# Patient Record
Sex: Female | Born: 1960
Health system: Southern US, Community
[De-identification: ages and names within clinical notes are randomized; demographics above are authoritative.]

## PROBLEM LIST (undated history)

## (undated) DIAGNOSIS — K9041 Non-celiac gluten sensitivity: Secondary | ICD-10-CM

## (undated) DIAGNOSIS — N9489 Other specified conditions associated with female genital organs and menstrual cycle: Secondary | ICD-10-CM

## (undated) DIAGNOSIS — M349 Systemic sclerosis, unspecified: Secondary | ICD-10-CM

## (undated) DIAGNOSIS — I7300 Raynaud's syndrome without gangrene: Secondary | ICD-10-CM

## (undated) HISTORY — DX: Raynaud's syndrome without gangrene: I73.00

## (undated) HISTORY — DX: Systemic sclerosis, unspecified: M34.9

## (undated) HISTORY — PX: TONSILLECTOMY: SUR1361

## (undated) HISTORY — DX: Other specified conditions associated with female genital organs and menstrual cycle: N94.89

---

## 1988-02-26 HISTORY — PX: DILATION AND CURETTAGE OF UTERUS: SHX78

## 1997-09-29 ENCOUNTER — Other Ambulatory Visit: Admission: RE | Admit: 1997-09-29 | Discharge: 1997-09-29 | Payer: Self-pay | Admitting: Obstetrics & Gynecology

## 2000-03-07 ENCOUNTER — Other Ambulatory Visit: Admission: RE | Admit: 2000-03-07 | Discharge: 2000-03-07 | Payer: Self-pay | Admitting: Obstetrics & Gynecology

## 2001-04-06 ENCOUNTER — Other Ambulatory Visit: Admission: RE | Admit: 2001-04-06 | Discharge: 2001-04-06 | Payer: Self-pay | Admitting: Obstetrics and Gynecology

## 2001-05-27 ENCOUNTER — Encounter: Admission: RE | Admit: 2001-05-27 | Discharge: 2001-08-25 | Payer: Self-pay

## 2001-10-01 ENCOUNTER — Encounter: Admission: RE | Admit: 2001-10-01 | Discharge: 2001-12-30 | Payer: Self-pay | Admitting: *Deleted

## 2002-04-06 ENCOUNTER — Encounter: Admission: RE | Admit: 2002-04-06 | Discharge: 2002-07-05 | Payer: Self-pay | Admitting: *Deleted

## 2004-03-19 ENCOUNTER — Other Ambulatory Visit: Admission: RE | Admit: 2004-03-19 | Discharge: 2004-03-19 | Payer: Self-pay | Admitting: Obstetrics and Gynecology

## 2004-09-29 ENCOUNTER — Encounter: Admission: RE | Admit: 2004-09-29 | Discharge: 2004-09-29 | Payer: Self-pay | Admitting: Diagnostic Radiology

## 2005-04-15 ENCOUNTER — Other Ambulatory Visit: Admission: RE | Admit: 2005-04-15 | Discharge: 2005-04-15 | Payer: Self-pay | Admitting: Obstetrics and Gynecology

## 2005-04-18 ENCOUNTER — Encounter: Admission: RE | Admit: 2005-04-18 | Discharge: 2005-04-18 | Payer: Self-pay | Admitting: Obstetrics & Gynecology

## 2006-11-04 ENCOUNTER — Ambulatory Visit: Payer: Self-pay | Admitting: Physical Medicine & Rehabilitation

## 2006-11-04 ENCOUNTER — Encounter
Admission: RE | Admit: 2006-11-04 | Discharge: 2007-02-02 | Payer: Self-pay | Admitting: Physical Medicine & Rehabilitation

## 2006-11-21 ENCOUNTER — Encounter
Admission: RE | Admit: 2006-11-21 | Discharge: 2006-11-21 | Payer: Self-pay | Admitting: Physical Medicine & Rehabilitation

## 2007-01-16 ENCOUNTER — Ambulatory Visit: Payer: Self-pay | Admitting: Physical Medicine & Rehabilitation

## 2007-01-26 ENCOUNTER — Encounter
Admission: RE | Admit: 2007-01-26 | Discharge: 2007-02-25 | Payer: Self-pay | Admitting: Physical Medicine & Rehabilitation

## 2007-02-23 ENCOUNTER — Ambulatory Visit: Payer: Self-pay | Admitting: Physical Medicine & Rehabilitation

## 2007-02-23 ENCOUNTER — Encounter
Admission: RE | Admit: 2007-02-23 | Discharge: 2007-05-07 | Payer: Self-pay | Admitting: Physical Medicine & Rehabilitation

## 2010-02-25 HISTORY — PX: LAPAROSCOPIC SALPINGOOPHERECTOMY: SUR795

## 2010-02-25 HISTORY — PX: UPPER GASTROINTESTINAL ENDOSCOPY: SHX188

## 2010-02-25 HISTORY — PX: OTHER SURGICAL HISTORY: SHX169

## 2010-07-10 NOTE — Assessment & Plan Note (Signed)
[name] returns today, 50 year old female, onset of right elbow pain  approximately 2 years ago.  I last saw her November 04, 2006.  She has  had some improvement after doing some isometric exercises to strengthen  the wrist extensor muscles.  She has not continued playing tennis.  She  has had no new problems other than some flareup in left-sided neck pain.   PHYSICAL EXAMINATION:  GENERAL:  No acute distress.  Mood and affect  appropriate.  She has really no tenderness to palpation of the lateral epicondyle on  the right side, no pain with extension of the wrist.  On the left side  of the neck, she has pain in the upper trap all the way up from the  insertion at the base of the skull to the midclavicular area.  NECK:  Range of motion reduced to 50% range, forward flexion, extension,  lateral rotation, and bending.   IMPRESSION:  1. Lateral epicondylitis, improving.  She can do concentric exercises,      wrist flexion followed by wrist extension.  2. Neck pain.  Appears to be myofascial, left trapezius.  Showed her      some stretching exercises as well as some shoulder rolls.  Tried      some icy hot as well as heat.  I will see her back in 6 weeks.  We      may need to do trigger points if not much better.   Her elbow films were reviewed, and no obvious abnormality was seen.      Erick Colace, M.D.  Electronically Signed     AEK/MedQ  D:  11/24/2006 11:14:16  T:  11/24/2006 12:23:03  Job #:  [id number]

## 2010-07-10 NOTE — Assessment & Plan Note (Signed)
HISTORY:  The patient returns today.  She was last seen by me on  January 19, 2007.  This is a 50 year old female.  She has been seen by  hand surgery, Dr. Donia Pounds.  She was treated conservatively in the past  with physical therapy, ibuprofen  and activity modification.  She has  had no steroid injections.  She has had MRI of the right elbow on September 29, 2004. Has had some increased signal intensity at the common extensor  tendon compatible with lateral epicondylitis and no tendon tears at that  time.  She had a flare of symptoms over the summer.  She had constant  pain, but now has gotten to a point where more of an intermittent pain.  It is activity related with forceful gripping.  She has had some neck  pain, but no radiating pain down the arms.  She has had some myofascial  pain in the trapezius area.  He has tried some heat, shoulder rolls and  creams.  She has gone to physical therapy with  Claris Gower.  Tried  iontophoresis times three visits.  Really no improvements with that.  She has no neck type pain.  She has had samples of Voltaren which she  used for a couple of days but then did not use anymore.  Her pain is in  the 2-3/10 range.  Her sleep is overall good.  Pain does improve with  ice.  She takes intermittent ibuprofen.   NECK:  Negative Spurling's maneuver.  There is mild tenderness of her  trapezius bilaterally.  She has full strength of the deltoid,  as well  as wrist extensors.  She does have pain with wrist extension but she is  able to maintain against force and resistance.  She had no hand  intrinsic atrophy.  Normal sensation in the upper extremities and normal  deep tendon reflexes.  She has normal range of motion of the shoulders,  wrists, hands and elbows.  Negative impingement sign testing.   IMPRESSION:  Right lateral epicondylitis.  Appears to be a tendinosis,  chronic intermittent pain which is mainly activity related.  We talked  about treatment  options.  She has already trialed some physical therapy.  She  has not tried any prolonged bracing and we will get her to splint  through physical therapy.  I would like her to continue to do some  isometric exercises every day, both in extension and supination.  She  should remain in this splint for a minimum of eight weeks, possibly up  to twelve.  I am not sure can comply with this.  After which, we will  take her out and try to progress her again to isotonic.  The patient was  also wondering if she should be evaluated by Dr. Donia Pounds and certainly,  this can be arranged.  I will forward a copy of my note to him.   Other potential treatment options would be Botox injection.  Laser also  may be a potential treatment option.  Re-trial of topical NSAIDS.   I will see her back in about three months.      Erick Colace, M.D.  Electronically Signed     AEK/MedQ  D:  02/24/2007 10:14:33  T:  02/24/2007 11:51:54  Job #:  [id number]   cc:   Jonelle Sports. Cheryll Cockayne, M.D.  Fax: [phone]

## 2010-07-10 NOTE — Group Therapy Note (Signed)
CHIEF COMPLAINT:  Right elbow pain.   Ms. Nordmann is a 50 year old female who notes onset of right elbow pain  approximately 2 years ago.  Onset occurred while playing tennis  approximately 4 days per week.  She saw Dr. Teressa Senter, from orthopedic hand  surgery, and an MRI of the right elbow was performed September 29, 2004 and  compared to radiographs at [facility] dated September 24, 2004.  There is mild, fleeting increased signal in the common  extensor tendon compatible with lateral epicondylitis.  There is no  tendon tear.  There are no ligamentous abnormalities.  No radial head  abnormalities.  Biceps, triceps, hand brachialis tendons appeared  normal.  Ulnar nerve appeared normal.  A course of physical therapy  which included what sounds like iontophoresis as well as manual therapy  and active exercise resulted in a decrease in symptoms; however, over  the last 2 years there have been episodes of exacerbation.  Recent  activities that have increased symptoms include using a caulking gun.  The patient continued playing tennis up until earlier this summer and  really has not played since July.  She has tried acupuncture which the  first couple of treatments seemed to be helpful but had some increase in  right elbow pain following approximately the fourth treatment.   The patient has been using nonsteroidal anti-inflammatories, although  she has used these less recently when concerned about the effects with  tissue healing.  She also uses OTC ibuprofen from time to time.   The patient does not do any directed stretching or exercise for the  right wrist or forearm.   Past medical history is significant for hypothyroidism.  She sees Dr.  Casimiro Needle Altheimer and takes Synthroid on a regular basis.  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   Pain rating is in the 6-8 range.  Interferes with general activity.  It  is moderate range in enjoyment of life as  well.  Pain is worse during  the daytime as well as at night and more noticeable at night; however,  sleep is rated as good.  Pain does improve with rest as well as the  medications listed above.  She has no functional limitations other than  activities that require forceful gripping, and as noted above, has not  been able to play tennis.   SOCIAL HISTORY:  Married.  Lives with her husband and children.   REVIEW OF SYSTEMS:  Negative for significant neck pain.  Has occasional  numbness in the forearm area except this is not a major symptom.  No  hand numbness.   PHYSICAL EXAMINATION:  Blood pressure 92/54, pulse 56, respiratory rate  is 18.  O2 sat 100% on room air.  GENERAL:  No acute distress.  Mood and affect appropriate.  Orientation  x3.  Gait is normal.  Neck is full range of motion.  Foraminal compression tests, including  Spurling's maneuver, are negative.  NECK:  Trapezius and deltoid areas are nontender to palpation.  NEUROLOGICAL:  Has normal sensation of C5, C6, C7, C8, T1 dermatomes.  Deep tendon reflexes, bilateral biceps, triceps, brachial radialis are  normal.  Hands show no evidence of intrinsic atrophy, no evidence of  fasciculations in the upper extremities.  There is tenderness over the  right lateral epicondyle and also over the radial head.  Also tenderness  along the extensor wrist extensor muscle group slightly below the crease  available.  There is no  significant pain with resisted extension at this  time, although in a flexed position, however, in extended position this  maneuver does cause some pain around the lateral condylar area.  There  is no pain with arm extension over the triceps insertion.  Negative  Finkelstein's.   IMPRESSION:  Lateral epicondylitis, recurrent.   Given some tenderness over the radial head we will get x-rays of that  area.   Discussed treatment options, and in fact, gave her literature on this as  well.   I explained that  the mainstay of treatment will be stretching and  strengthening of the extensor muscle group on the right side.  She is in  a subacute stage of pain and inflammation, and I think at this point she  does not need to have any type of wrist bracing but instead start with  isometric exercises, holding up a 1-pound weight approximately 30 sec- 1  minute and relaxing at least 2 minutes between sets doing a total of 3  sets every other day.  This will be followed by wrist stretching as she  has already been instructed to do by physical therapy, and this will be  followed by icing.   Once this can be done in pain free fashion then weight can be increased  to 2 or 3 pounds and continue in the same 1-minute isometric 3 sets with  rest at least 2 minutes in between and icing afterwards.  She can  gradually work up to 5 pounds of weight as tolerated, and if this can be  done in a pain free fashion  to start with isotonic exercises; however,  I would like to see the patient back in approximately 3 weeks to follow  up with an x-ray and see how she is progressing.  If this protocol does  not result in reduced pain and increased functioning would recommend  another course of formalized physical therapy.   In terms of pain relief at night I would recommend Lidoderm Patch on  nightly/off q.a.m.  This can be worn, in fact, about 12 hours per day,  on at for instance 7 p.m., off at 7 a.m.   Tylenol can be used on a daily basis within dosing recommendations as  needed.  I would reserve the use of nonsteroidal anti-inflammatories for  more severe flare-ups.  Other potential treatments include topical  NSAIDs such as diclofenac patches or gel.   Given no significant results with acupuncture treatment and would not  recommend another trial of this at this point.      Erick Colace, M.D.  Electronically Signed     AEK/MedQ  D:  11/04/2006 15:54:11  T:  11/05/2006 10:47:14  Job #:  [id number]    cc:   Georga Hacking, M.D.  Fax: [phone]  Email: [email or web address]

## 2010-07-10 NOTE — Assessment & Plan Note (Signed)
Ms. Weddington returns today.  She is a 50 year old female last seen by me  on November 24, 2006, with onset of right elbow pain, approximately 2  years ago.  She has had some improvement after doing isometric  exercises.  The strength in the wrist extensor muscles.  She had been  doing well when I last saw her.  She progressed to concentric exercises  as well as helped her mother paint and this resulted in a flare up of  her right elbow pain.  She denies any numbness or tingling in her  fingers.  She denies any significant neck pain.  She has no arm  weakness.   PHYSICAL EXAMINATION:  GENERAL:  No acute distress.  Mood and affect  appropriate.  NECK:  Good range of motion.  MUSCULOSKELETAL:  Her motor strength is 5/5 in the deltoid, biceps,  triceps, grip as well as wrist extensor and finger extensors.  She has  pain in the lateral epicondyle area just at the insertion site which is  exacerbated by extension of the wrist.  Range of motion is full at the  elbow, wrist, fingers, and shoulder.  She has no tenderness over the  triceps tendon insertion.  No numbness in the hand.   IMPRESSION:  Lateral epicondylitis.  She has had another exacerbation.   1. We talked about tendinosis versus tendonitis and how she has      chronic changes in that area and will be susceptible to flare ups      from time to time.  At the same time, I do think she was doing      better the last time I saw her and either she progressed too      quickly with her concentric exercises or had a flare up related to      painting or a combination of both.  Fortunately, she is not having      any night pains to indicate any sign of significant inflammatory      component and therefore, we will just send her to physical therapy      for  modalities  and supervised progression of her home exercise      program.  2. I will see her back in 4-6 weeks.  3. I have also given her some samples of Voltaren gel and a  prescription for this.      Erick Colace, M.D.  Electronically Signed     AEK/MedQ  D:  01/19/2007 16:24:25  T:  01/19/2007 23:14:26  Job #:  [id number]

## 2010-07-31 ENCOUNTER — Other Ambulatory Visit: Payer: Self-pay | Admitting: Gastroenterology

## 2010-08-01 ENCOUNTER — Ambulatory Visit
Admission: RE | Admit: 2010-08-01 | Discharge: 2010-08-01 | Disposition: A | Discharge status: Home / Self Care | Payer: BC Managed Care – PPO | Source: Ambulatory Visit | Attending: Gastroenterology | Admitting: Gastroenterology

## 2010-10-02 ENCOUNTER — Ambulatory Visit (INDEPENDENT_AMBULATORY_CARE_PROVIDER_SITE_OTHER): Payer: BC Managed Care – PPO | Admitting: Sports Medicine

## 2010-10-02 VITALS — BP 116/68 | Ht 62.0 in | Wt 100.0 lb

## 2010-10-02 DIAGNOSIS — M25561 Pain in right knee: Secondary | ICD-10-CM | POA: Insufficient documentation

## 2010-10-02 DIAGNOSIS — M25529 Pain in unspecified elbow: Secondary | ICD-10-CM

## 2010-10-02 DIAGNOSIS — M7711 Lateral epicondylitis, right elbow: Secondary | ICD-10-CM | POA: Insufficient documentation

## 2010-10-02 DIAGNOSIS — M25521 Pain in right elbow: Secondary | ICD-10-CM

## 2010-10-02 DIAGNOSIS — M7710 Lateral epicondylitis, unspecified elbow: Secondary | ICD-10-CM

## 2010-10-02 DIAGNOSIS — M25569 Pain in unspecified knee: Secondary | ICD-10-CM

## 2010-10-02 MED ORDER — NITROGLYCERIN 0.2 MG/HR TD PT24: MEDICATED_PATCH | TRANSDERMAL | Status: DC

## 2010-10-02 NOTE — Assessment & Plan Note (Signed)
She can continue using topical Voltaren since this has been helpful  Use ibuprofen or Aleve as needed

## 2010-10-02 NOTE — Progress Notes (Signed)
  Subjective:    Patient ID: Tiffany Park, female    DOB: [birth year], 50 y.o.   MRN: [id number]  HPI  Pt presents to clinic for evaluation of rt lateral elbow pain which she has had a history of for several years, but irritated 1 month ago.   Originally injured playing tennis, and controlled with strap and voltaren gel.  [place], pain has been more persistent since then.  Raising a book or lifting anything may cause pain. Most of the pain is lateral but occasionally extends posterior into the medial side  Has right knee pain that presents when standing after prolonged sitting or squatting to garden.  Posterior knee also tight with extension.  She has a history of complete avulsion of the hamstring tendon in the left leg. During that time she was weak for approximately a year and did physical therapy. She was very much dependent on her right leg while this was recovering. She denies any acute knee injuries and does not see any swelling.  Review of Systems     Objective:   Physical Exam No acute distress  Full extension of bilat elbows Supination and prontation full bilat Tender on rt lateral epicondyle  Slight pain with resisted wrist extension on rt No pain with finger extension on rt Resisted wrist flexion painful on rt No pain with supination of rt hand  Book test positive on rt  Rt knee exam:  Full ROM on rt knee Slight click of patella with flexion  Ligaments stable Good quad strength on rt Good abduction strength bilat  Musculoskeletal ultrasound Right elbow shows a small avulsion fragment at the tip of the lateral epicondyle The tendon has a few discrete areas of calcification On transverse view there is visualized a calcified linear trick but suggests a partial avulsion of the tendon from its origin and there is some hypoechoic change.  Doppler activity is normal      Assessment & Plan:

## 2010-10-02 NOTE — Assessment & Plan Note (Signed)
She is given a standard series of exercises to strengthen the lateral epicondyle to include wrist extensions and wrist supination  She is an excellent candidate for nitroglycerin protocol and this was described to the patient along with side effects  I would like to recheck her In 4-6 wks  She was given an elbow compression wrap since this also covers the medial elbow and triceps area

## 2010-10-02 NOTE — Assessment & Plan Note (Signed)
We will begin a series of hamstring exercises to improve the strength of both hamstrings  He will use a compression sleeve to see if this keeps the hamstring warmer and if it gets less tight she may have less knee pain  She may continue to play tennis

## 2010-10-31 ENCOUNTER — Ambulatory Visit: Payer: BC Managed Care – PPO | Admitting: Sports Medicine

## 2010-11-13 ENCOUNTER — Ambulatory Visit (INDEPENDENT_AMBULATORY_CARE_PROVIDER_SITE_OTHER): Payer: BC Managed Care – PPO | Admitting: Sports Medicine

## 2010-11-13 VITALS — BP 105/65

## 2010-11-13 DIAGNOSIS — S76399A Other specified injury of muscle, fascia and tendon of the posterior muscle group at thigh level, unspecified thigh, initial encounter: Secondary | ICD-10-CM | POA: Insufficient documentation

## 2010-11-13 DIAGNOSIS — M7710 Lateral epicondylitis, unspecified elbow: Secondary | ICD-10-CM

## 2010-11-13 DIAGNOSIS — IMO0002 Reserved for concepts with insufficient information to code with codable children: Secondary | ICD-10-CM

## 2010-11-13 DIAGNOSIS — M7711 Lateral epicondylitis, right elbow: Secondary | ICD-10-CM

## 2010-11-13 DIAGNOSIS — S76319A Strain of muscle, fascia and tendon of the posterior muscle group at thigh level, unspecified thigh, initial encounter: Secondary | ICD-10-CM | POA: Insufficient documentation

## 2010-11-13 NOTE — Progress Notes (Signed)
  Subjective:    Patient ID: Tiffany Park, female    DOB: 1960-03-27, 50 y.o.   MRN: [id number]  HPI Still with sxs at RT elbow No side effects with NTG now Has taken this for 6 weeks Compression sleeve was too tight  Using compression sleeves for RT Hamstring This does help Able to run Able to play tennis 3 x week  Left HS with old Avulsion is less painful   Review of Systems     Objective:   Physical Exam NAD  Full ROM of RT elbow Mild TTP at tip of lat epic Pain on resisted finger extension More at 3rd finger Pain on wrist extesnion No swelling  Has good HS strength bilat Large defect and central retraction of medial HS on left       Assessment & Plan:

## 2010-11-13 NOTE — Assessment & Plan Note (Signed)
Keep up compression sleeve  Keep up exercises as advised

## 2010-11-13 NOTE — Assessment & Plan Note (Signed)
This is only mildly improved  Cont exercises  Change compression sleeve to larger size  Increase NTG to 1/2 patch  Reck 6 wks

## 2010-11-13 NOTE — Assessment & Plan Note (Signed)
Consider compression  Add exercises to maintain strength

## 2010-12-12 ENCOUNTER — Ambulatory Visit: Payer: BC Managed Care – PPO | Attending: Gynecologic Oncology | Admitting: Gynecologic Oncology

## 2010-12-12 DIAGNOSIS — K900 Celiac disease: Secondary | ICD-10-CM | POA: Insufficient documentation

## 2010-12-12 DIAGNOSIS — I7300 Raynaud's syndrome without gangrene: Secondary | ICD-10-CM | POA: Insufficient documentation

## 2010-12-12 DIAGNOSIS — E890 Postprocedural hypothyroidism: Secondary | ICD-10-CM | POA: Insufficient documentation

## 2010-12-12 DIAGNOSIS — Z801 Family history of malignant neoplasm of trachea, bronchus and lung: Secondary | ICD-10-CM | POA: Insufficient documentation

## 2010-12-12 DIAGNOSIS — N839 Noninflammatory disorder of ovary, fallopian tube and broad ligament, unspecified: Secondary | ICD-10-CM | POA: Insufficient documentation

## 2010-12-14 NOTE — Consult Note (Signed)
Tiffany Park, Tiffany Park               ACCOUNT NO.:  [id number]  MEDICAL RECORD NO.:  [id number]  LOCATION:  GYN                          FACILITY:  [facility]  PHYSICIAN:  Mcihael Hinderman A. Duard Brady, MD    DATE OF BIRTH:  [birth year]  DATE OF CONSULTATION:  12/12/2010 DATE OF DISCHARGE:                                CONSULTATION   REFERRING PHYSICIAN:  Dr. Leitha Schuller.  HISTORY:  Patient is seen today in consultation at the request of Dr. Jennette Kettle.  Tiffany Park is a very pleasant 50 year old, gravida 3, para 2, whose last period was approximately 2 weeks ago.  She is on estrogen with q.78-month withdrawal with Provera.  She underwent an ultrasound to evaluate her cycle thickness on September 12 of the year 2012.  Findings at that time revealed a retroverted uterus without evidence of the mass. The right ovary appeared normal.  There was a small amount of free fluid in the area of the right adnexa.  Within the left ovary, there was a septated complex cystic mass with a solid component.  The cystic area had no blood flow and measured 1.5 x 1.1 x 0.9 cm.  The solid component had no blood flow and measured 1 x 0.7 cm.  The differential as stated by the radiology tech was a mucinous cystadenoma versus an ovarian neoplasm.  The left adnexa was otherwise normal.  She did have an OVA1 test performed with the result of 4.5 with a premenopausal normal being 5 and a postmenopausal normal being greater than 4.4.  She comes in today.  She is overall completely asymptomatic and states that the only reason this was done was because of the ultrasound and not that she had any discomfort whatsoever.  Her last cycle was 2 weeks ago.  Her cycles were every 3-6 weeks.  Occasionally, she will withdraw with her progesterone, but also has significant spontaneous menses.  She denies any hot flashes.  She has occasional night sweats.  She has noticed some increasing cramping with her cycles, but she felt that was related to age.   She does denies any bloating with exception of about a year ago. She was diagnosed with some gluten intolerance and if she is not 100% compliant with her diet, she will have some bloating.  She otherwise denies any early satiety or other symptoms.  PAST MEDICAL HISTORY: 1. History of hyperthyroidism treated 15-16 years ago with radiation     therapy, now clinically hypothyroid. 2. Gluten intolerance. 3. Raynaud's. 4. Scleroderma.  PAST SURGICAL HISTORY: 1. She had tonsillectomy. 2. She had a D and C after missed AB.  FAMILY HISTORY:  Significant for coronary disease and hypertension in her grandmother.  She had an aunt who died of cancer, that was lung cancer, she was a smoker.  HEALTH MAINTENANCE:  She is due for her mammogram in November.  Her Pap smears are up to date with Dr. Jennette Kettle.  She had a colonoscopy and EGD that were both negative.  SOCIAL HISTORY:  She denies use of tobacco.  She drinks alcohol occasionally.  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  PHYSICAL EXAMINATION:  VITAL SIGNS: Weight 98 pounds, height 5 feet 2 inches, blood pressure 182/60, pulse 58, respirations 14, temperature 98. GENERAL:  Well-nourished, well-developed female in no acute distress. NECK:  Supple.  There is no lymphadenopathy.  No thyromegaly. LUNGS:  Clear to auscultation bilaterally. CARDIOVASCULAR:  Regular rate and rhythm. ABDOMEN:  Soft, nontender, nondistended.  No palpable masses or hepatosplenomegaly.  Groins are negative for adenopathy. EXTREMITIES:  No edema. PELVIC: External genitalia is within normal limits. BIMANUAL EXAMINATION:  The cervix is palpably normal.  The corpus is retroflexed, otherwise normal size, shape, consistency.  There are no adnexal masses.  ASSESSMENT:  50 year old with a complex 1-cm adnexal mass involving the left ovary with an OVA1 that was 4.5, which is normal in a premenopausal female and while she is on HRT,  she is still functionally premenopausal as she has spontaneous menses.  I discussed these results with her.  We had a lengthy discussion regarding proceeding with surgery for removal of the adnexa on frozen section.  We discussed the possibility of performing hysterectomy and bilateral salpingo-oophorectomy, or performing a unilateral salpingo-oophorectomy or even close followup, as her HE4 is negative and it can be falsely elevated due to her autoimmune disorders. At this point, she is not sure how she would like to proceed.  She would like to speak with her husband and call me back.  She also states her husband may want to speak with me on the telephone.  She was offered several surgical dates including 2 at [facility] on October 26th and November 6th as the soonest that we have available; however, she states that she does not feel like this is urgent and would be much better for her to have surgery here than in [place], at which point, I offered her the date of November 13th with me in [place].  She will consider both of these options and will call me back.  Addendum: I had the opportunity to speak with the patient's husband and they would like to proceed with laparoscopic USO. The date that best fits their  schedule is November 27th 2012 and we will schedule her surgery accordinggly.      Willard Madrigal A. Duard Brady, MD     PAG/MEDQ  D:  12/12/2010  T:  12/12/2010  Job:  [id number]  cc:   Telford Nab, R.N. [address] [place], [place] [address]  Veverly Fells. Altheimer, M.D. Fax: [phone]  Dr. Varney Baas  Electronically Signed by Cleda Mccreedy MD on 12/14/2010 10:41:55 AM

## 2011-01-01 ENCOUNTER — Encounter: Payer: Self-pay | Admitting: Sports Medicine

## 2011-01-01 ENCOUNTER — Ambulatory Visit (INDEPENDENT_AMBULATORY_CARE_PROVIDER_SITE_OTHER): Payer: BC Managed Care – PPO | Admitting: Sports Medicine

## 2011-01-01 VITALS — BP 96/55 | HR 55

## 2011-01-01 DIAGNOSIS — M7711 Lateral epicondylitis, right elbow: Secondary | ICD-10-CM

## 2011-01-01 DIAGNOSIS — M7710 Lateral epicondylitis, unspecified elbow: Secondary | ICD-10-CM

## 2011-01-01 MED ORDER — NITROGLYCERIN 0.2 MG/HR TD PT24: MEDICATED_PATCH | TRANSDERMAL | Status: DC

## 2011-01-01 NOTE — Assessment & Plan Note (Signed)
Improved with NTG patch and exercises and sleeve.  Plan to continue the patches and exercises for 2 months.  Will RTC at that time.

## 2011-01-01 NOTE — Progress Notes (Signed)
Here to follow up for right lateral epicondylitis.   In the interval she has used 1/2 nitroglycerin patches and eccentric exercises. She feels about 70-80% improved since the last visit.  She is also using a strap and elbow sleeve.  She is now able to play tennis. She notes pain when she picks up heavy objects while extending her wrist. She denies any swelling or redness in her elbow.   Exam:  BP 96/55  Pulse 55 Gen: Well NAD MSK: Normal appearing elbow. No swelling or erythremia. Non tender to palpation. Not able to completely hold heavy book in her hand with her arm and wrist extended.   MS[employer] [place] [employer]: Normal appearing ski slope. No neo-vessels.    Assessment:  Raynauds and localized scleroderma also discussed  I think she could try very low dose of top NTG on digital vessels to see if this lessens sxs Can't take CCBs with low BP

## 2011-01-15 ENCOUNTER — Ambulatory Visit (HOSPITAL_COMMUNITY)
Admission: RE | Admit: 2011-01-15 | Discharge: 2011-01-15 | Disposition: A | Discharge status: Home / Self Care | Payer: BC Managed Care – PPO | Source: Ambulatory Visit | Attending: Gynecologic Oncology | Admitting: Gynecologic Oncology

## 2011-01-15 ENCOUNTER — Encounter (HOSPITAL_COMMUNITY): Payer: Self-pay | Admitting: Pharmacy Technician

## 2011-01-15 ENCOUNTER — Encounter (HOSPITAL_COMMUNITY): Payer: Self-pay

## 2011-01-15 DIAGNOSIS — R1909 Other intra-abdominal and pelvic swelling, mass and lump: Secondary | ICD-10-CM | POA: Insufficient documentation

## 2011-01-15 DIAGNOSIS — Z01812 Encounter for preprocedural laboratory examination: Secondary | ICD-10-CM | POA: Insufficient documentation

## 2011-01-15 DIAGNOSIS — Z01818 Encounter for other preprocedural examination: Secondary | ICD-10-CM | POA: Insufficient documentation

## 2011-01-15 HISTORY — DX: Non-celiac gluten sensitivity: K90.41

## 2011-01-15 LAB — COMPREHENSIVE METABOLIC PANEL
Albumin: 3.9 g/dL (ref 3.5–5.2)
BUN: 16 mg/dL (ref 6–23)
Creatinine, Ser: 0.73 mg/dL (ref 0.50–1.10)
GFR calc Af Amer: >90 mL/min (ref >90)
GFR calc non Af Amer: >90 mL/min (ref >90)
Potassium: 4.1 mEq/L (ref 3.5–5.1)

## 2011-01-15 LAB — DIFFERENTIAL
Lymphocytes Relative: 19 % (ref 12–46)
Lymphs Abs: 1.4 10*3/uL (ref 0.7–4.0)
Monocytes Absolute: 0.5 10*3/uL (ref 0.1–1.0)
Neutro Abs: 5.4 10*3/uL (ref 1.7–7.7)

## 2011-01-15 LAB — CBC
Hemoglobin: 13 g/dL (ref 12.0–15.0)
MCH: 29.1 pg (ref 26.0–34.0)
Platelets: 274 10*3/uL (ref 150–400)
RBC: 4.46 MIL/uL (ref 3.87–5.11)
RDW: 13.5 % (ref 11.5–15.5)

## 2011-01-15 NOTE — Pre-Procedure Instructions (Signed)
Pt called and instructed clear liquids all day Monday 01-21-11 per nancy wilkinson rn, msn

## 2011-01-15 NOTE — Pre-Procedure Instructions (Signed)
ekg 01-14-11 dr Donnie Aho on chart

## 2011-01-15 NOTE — Patient Instructions (Addendum)
20 Tiffany Park  01/15/2011   Your procedure is scheduled on:  01-22-11 Report to [facility] at 0845 AM.  Call this number if you have problems the morning of surgery: [phone]   Remember:   Do not eat food:After Midnight.  Do not drink clear liquids: After Midnight.  Take these medicines the morning of surgery with A SIP OF WATER: levothryoxine   Do not wear jewelry, make-up or nail polish.  Do not wear lotions, powders, or perfumes..  Do not shave 48 hours prior to surgery.  Do not bring valuables to the hospital.  Contacts, dentures or bridgework may not be worn into surgery.  Leave suitcase in the car. After surgery it may be brought to your room.  For patients admitted to the hospital, checkout time is 11:00 AM the day of discharge.   Patients discharged the day of surgery will not be allowed to drive home.  Name and phone number of your driver: Tiffany Park spouse [phone] cell  Special Instructions: CHG Shower Use Special Wash: 1/2 bottle night before surgery and 1/2 bottle morning of surgery. neck down avoid private area, no shaving 2 days before showers   Please read over the following fact sheets that you were given: MRSA Information, incentive spirometer fact sheet

## 2011-01-18 LAB — MRSA CULTURE

## 2011-01-22 ENCOUNTER — Other Ambulatory Visit: Payer: Self-pay | Admitting: Gynecologic Oncology

## 2011-01-22 ENCOUNTER — Ambulatory Visit (HOSPITAL_COMMUNITY)
Admission: RE | Admit: 2011-01-22 | Discharge: 2011-01-22 | Disposition: A | Discharge status: Home / Self Care | Payer: BC Managed Care – PPO | Source: Ambulatory Visit | Attending: Gynecologic Oncology | Admitting: Gynecologic Oncology

## 2011-01-22 ENCOUNTER — Encounter (HOSPITAL_COMMUNITY): Payer: Self-pay | Admitting: Anesthesiology

## 2011-01-22 ENCOUNTER — Ambulatory Visit (HOSPITAL_COMMUNITY): Payer: BC Managed Care – PPO | Admitting: Anesthesiology

## 2011-01-22 ENCOUNTER — Encounter (HOSPITAL_COMMUNITY): Payer: Self-pay | Admitting: *Deleted

## 2011-01-22 ENCOUNTER — Encounter (HOSPITAL_COMMUNITY)
Admission: RE | Disposition: A | Discharge status: Home / Self Care | Payer: Self-pay | Source: Ambulatory Visit | Attending: Gynecologic Oncology

## 2011-01-22 DIAGNOSIS — R978 Other abnormal tumor markers: Secondary | ICD-10-CM | POA: Insufficient documentation

## 2011-01-22 DIAGNOSIS — N8300 Follicular cyst of ovary, unspecified side: Secondary | ICD-10-CM | POA: Insufficient documentation

## 2011-01-22 DIAGNOSIS — M349 Systemic sclerosis, unspecified: Secondary | ICD-10-CM | POA: Insufficient documentation

## 2011-01-22 DIAGNOSIS — N9489 Other specified conditions associated with female genital organs and menstrual cycle: Secondary | ICD-10-CM | POA: Insufficient documentation

## 2011-01-22 DIAGNOSIS — E890 Postprocedural hypothyroidism: Secondary | ICD-10-CM | POA: Insufficient documentation

## 2011-01-22 DIAGNOSIS — K900 Celiac disease: Secondary | ICD-10-CM | POA: Insufficient documentation

## 2011-01-22 DIAGNOSIS — I7300 Raynaud's syndrome without gangrene: Secondary | ICD-10-CM | POA: Insufficient documentation

## 2011-01-22 LAB — SAMPLE TO BLOOD BANK

## 2011-01-22 SURGERY — SALPINGO-OOPHORECTOMY, BILATERAL, LAPAROSCOPIC
Anesthesia: General | Laterality: Left | Wound class: Clean

## 2011-01-22 MED ORDER — FENTANYL CITRATE 0.05 MG/ML IJ SOLN
INTRAMUSCULAR | Status: DC | PRN
  Administered 2011-01-22 (×2): 100 ug via INTRAVENOUS
  Administered 2011-01-22 (×2): 50 ug via INTRAVENOUS

## 2011-01-22 MED ORDER — GLYCOPYRROLATE 0.2 MG/ML IJ SOLN
INTRAMUSCULAR | Status: DC | PRN
  Administered 2011-01-22: .5 mg via INTRAVENOUS

## 2011-01-22 MED ORDER — PROPOFOL 10 MG/ML IV EMUL
INTRAVENOUS | Status: DC | PRN
  Administered 2011-01-22: 12 mL via INTRAVENOUS
  Administered 2011-01-22: 4 mL via INTRAVENOUS

## 2011-01-22 MED ORDER — SODIUM CHLORIDE 0.9 % IR SOLN
Status: DC | PRN
  Administered 2011-01-22: 1000 mL

## 2011-01-22 MED ORDER — HEPARIN SODIUM (PORCINE) 1000 UNIT/ML IJ SOLN
INTRAMUSCULAR | Status: AC
  Filled 2011-01-22: qty 1

## 2011-01-22 MED ORDER — LACTATED RINGERS IV SOLN
INTRAVENOUS | Status: DC
  Administered 2011-01-22 (×2): via INTRAVENOUS
  Administered 2011-01-22: 1000 mL via INTRAVENOUS

## 2011-01-22 MED ORDER — FENTANYL CITRATE 0.05 MG/ML IJ SOLN
INTRAMUSCULAR | Status: AC
  Filled 2011-01-22: qty 2

## 2011-01-22 MED ORDER — PROMETHAZINE HCL 25 MG/ML IJ SOLN: 6.2500 mg | INTRAMUSCULAR | Status: DC | PRN

## 2011-01-22 MED ORDER — OXYCODONE-ACETAMINOPHEN 5-325 MG PO TABS
1.0000 | ORAL_TABLET | Freq: Once | ORAL | Status: AC
  Administered 2011-01-22: 1 via ORAL

## 2011-01-22 MED ORDER — NEOSTIGMINE METHYLSULFATE 1 MG/ML IJ SOLN
INTRAMUSCULAR | Status: DC | PRN
  Administered 2011-01-22: 2.5 mg via INTRAVENOUS

## 2011-01-22 MED ORDER — ONDANSETRON HCL 4 MG/2ML IJ SOLN
INTRAMUSCULAR | Status: DC | PRN
  Administered 2011-01-22: 4 mg via INTRAVENOUS

## 2011-01-22 MED ORDER — SUCCINYLCHOLINE CHLORIDE 20 MG/ML IJ SOLN
INTRAMUSCULAR | Status: DC | PRN
  Administered 2011-01-22: 80 mg via INTRAVENOUS

## 2011-01-22 MED ORDER — MIDAZOLAM HCL 5 MG/5ML IJ SOLN
INTRAMUSCULAR | Status: DC | PRN
  Administered 2011-01-22 (×2): 1 mg via INTRAVENOUS

## 2011-01-22 MED ORDER — FENTANYL CITRATE 0.05 MG/ML IJ SOLN
25.0000 ug | INTRAMUSCULAR | Status: DC | PRN
  Administered 2011-01-22 (×2): 50 ug via INTRAVENOUS

## 2011-01-22 MED ORDER — CISATRACURIUM BESYLATE 2 MG/ML IV SOLN
INTRAVENOUS | Status: DC | PRN
  Administered 2011-01-22: 4 mg via INTRAVENOUS

## 2011-01-22 MED ORDER — EPHEDRINE SULFATE 50 MG/ML IJ SOLN
INTRAMUSCULAR | Status: DC | PRN
  Administered 2011-01-22: 5 mg via INTRAVENOUS

## 2011-01-22 MED ORDER — OXYCODONE-ACETAMINOPHEN 5-325 MG PO TABS: 1.0000 | ORAL_TABLET | ORAL | Status: DC | PRN

## 2011-01-22 MED ORDER — LIDOCAINE HCL (CARDIAC) 20 MG/ML IV SOLN
INTRAVENOUS | Status: DC | PRN
  Administered 2011-01-22: 80 mg via INTRAVENOUS

## 2011-01-22 MED ORDER — LACTATED RINGERS IV SOLN: INTRAVENOUS | Status: DC

## 2011-01-22 MED ORDER — OXYCODONE-ACETAMINOPHEN 5-325 MG PO TABS
ORAL_TABLET | ORAL | Status: AC
  Filled 2011-01-22: qty 1

## 2011-01-22 SURGICAL SUPPLY — 57 items
BAG URINE DRAINAGE (UROLOGICAL SUPPLIES) IMPLANT
BENZOIN TINCTURE PRP APPL 2/3 (GAUZE/BANDAGES/DRESSINGS) ×2 IMPLANT
BLADE SURG 15 STRL LF DISP TIS (BLADE) ×1 IMPLANT
BLADE SURG 15 STRL SS (BLADE) ×1
CABLE HIGH FREQUENCY MONO STRZ (ELECTRODE) ×2 IMPLANT
CANISTER SUCTION 2500CC (MISCELLANEOUS) ×2 IMPLANT
CATH FOLEY 2WAY SLVR  5CC 14FR (CATHETERS) ×1
CATH FOLEY 2WAY SLVR  5CC 16FR (CATHETERS)
CATH FOLEY 2WAY SLVR 5CC 14FR (CATHETERS) ×1 IMPLANT
CATH FOLEY 2WAY SLVR 5CC 16FR (CATHETERS) IMPLANT
CATH ROBINSON RED A/P 16FR (CATHETERS) IMPLANT
CHLORAPREP W/TINT 26ML (MISCELLANEOUS) ×2 IMPLANT
CLEANER TIP ELECTROSURG 2X2 (MISCELLANEOUS) IMPLANT
CLOTH BEACON ORANGE TIMEOUT ST (SAFETY) ×2 IMPLANT
COVER MAYO STAND STRL (DRAPES) ×2 IMPLANT
COVER SURGICAL LIGHT HANDLE (MISCELLANEOUS) ×2 IMPLANT
DEVICE SUTURE ENDOST 10MM (ENDOMECHANICALS) IMPLANT
DEVICE TROCAR PUNCTURE CLOSURE (ENDOMECHANICALS) IMPLANT
DRAPE LG THREE QUARTER DISP (DRAPES) ×2 IMPLANT
DRAPE TABLE BACK 44X90 PK DISP (DRAPES) ×2 IMPLANT
DRAPE UTILITY XL STRL (DRAPES) ×2 IMPLANT
ELECT REM PT RETURN 9FT ADLT (ELECTROSURGICAL) ×2
ELECTRODE REM PT RTRN 9FT ADLT (ELECTROSURGICAL) ×1 IMPLANT
ENDOSTITCH 0 SINGLE 48 (SUTURE) IMPLANT
GAUZE SPONGE 4X4 16PLY XRAY LF (GAUZE/BANDAGES/DRESSINGS) ×2 IMPLANT
GLOVE BIO SURGEON STRL SZ 6.5 (GLOVE) ×2 IMPLANT
GLOVE BIO SURGEON STRL SZ7.5 (GLOVE) ×4 IMPLANT
GLOVE BIOGEL PI IND STRL 7.0 (GLOVE) ×1 IMPLANT
GLOVE BIOGEL PI INDICATOR 7.0 (GLOVE) ×1
HOLDER FOLEY CATH W/STRAP (MISCELLANEOUS) IMPLANT
MANIPULATOR UTERINE 4.5 ZUMI (MISCELLANEOUS) ×2 IMPLANT
NS IRRIG 1000ML POUR BTL (IV SOLUTION) ×2 IMPLANT
PACK LAPAROSCOPY W LONG (CUSTOM PROCEDURE TRAY) ×2 IMPLANT
PENCIL BUTTON HOLSTER BLD 10FT (ELECTRODE) ×2 IMPLANT
POUCH SPECIMEN RETRIEVAL 10MM (ENDOMECHANICALS) ×2 IMPLANT
SCISSORS LAP 5X35 DISP (ENDOMECHANICALS) ×2 IMPLANT
SET IRRIG TUBING LAPAROSCOPIC (IRRIGATION / IRRIGATOR) IMPLANT
SHEET LAVH (DRAPES) ×2 IMPLANT
STRIP CLOSURE SKIN 1/2X4 (GAUZE/BANDAGES/DRESSINGS) IMPLANT
SUT VIC AB 0 CT1 18XCR BRD 8 (SUTURE) IMPLANT
SUT VIC AB 0 CT1 8-18 (SUTURE)
SUT VIC AB 2-0 CT2 27 (SUTURE) IMPLANT
SUT VIC AB 3-0 PS2 18 (SUTURE) ×1
SUT VIC AB 3-0 PS2 18XBRD (SUTURE) ×1 IMPLANT
SUT VICRYL 0 UR6 27IN ABS (SUTURE) ×2 IMPLANT
SUT VICRYL 2 0 18  UND BR (SUTURE)
SUT VICRYL 2 0 18 UND BR (SUTURE) IMPLANT
SYR 50ML LL SCALE MARK (SYRINGE) ×2 IMPLANT
SYR CONTROL 10ML LL (SYRINGE) ×2 IMPLANT
TOWEL OR 17X26 10 PK STRL BLUE (TOWEL DISPOSABLE) ×2 IMPLANT
TROCAR BLADELESS OPT 5 100 (ENDOMECHANICALS) ×4 IMPLANT
TROCAR ENDOPATH XCEL 12X100 BL (ENDOMECHANICALS) ×2 IMPLANT
TROCAR XCEL 12X100 BLDLESS (ENDOMECHANICALS) IMPLANT
TROCAR XCEL BLUNT TIP 100MML (ENDOMECHANICALS) IMPLANT
TUBING NON-CON 1/4 X 20 CONN (TUBING) ×2 IMPLANT
UNDERPAD 30X30 INCONTINENT (UNDERPADS AND DIAPERS) IMPLANT
WARMER LAPAROSCOPE (MISCELLANEOUS) ×2 IMPLANT

## 2011-01-22 NOTE — Anesthesia Procedure Notes (Signed)
Date/Time: 01/22/2011 10:48 AM Performed by: Hulan Fess Pre-anesthesia Checklist: Patient identified, Emergency Drugs available, Suction available, Patient being monitored and Timeout performed Patient Re-evaluated:Patient Re-evaluated prior to inductionOxygen Delivery Method: Circle System Utilized Preoxygenation: Pre-oxygenation with 100% oxygen Intubation Type: IV induction Ventilation: Mask ventilation without difficulty Laryngoscope Size: Mac and 3 Grade View: Grade I Tube type: Oral Tube size: 8.0 mm Number of attempts: 1 Placement Confirmation: ETT inserted through vocal cords under direct vision,  positive ETCO2 and breath sounds checked- equal and bilateral Secured at: 22 cm Tube secured with: Tape Dental Injury: Dental damage

## 2011-01-22 NOTE — Transfer of Care (Signed)
Immediate Anesthesia Transfer of Care Note  Patient: Tiffany Park  Procedure(s) Performed:  LAPAROSCOPIC SALPINGO OOPHERECTOMY  Patient Location: PACU  Anesthesia Type: General  Level of Consciousness: sedated  Airway & Oxygen Therapy: Patient Spontanous Breathing and Patient connected to face mask oxygen  Post-op Assessment: Report given to PACU RN  Post vital signs: Reviewed and stable  Complications: No apparent anesthesia complications

## 2011-01-22 NOTE — Progress Notes (Signed)
Discharge instructions given to pt and pt's husband.  Both verbalized understanding.  Pt was experiencing some nausea with mild dizziness.  No vomitting.  Pt's vital signs stable and pt stable at discharge.

## 2011-01-22 NOTE — Anesthesia Postprocedure Evaluation (Signed)
  Anesthesia Post-op Note  Patient: Tiffany Park  Procedure(s) Performed:  LAPAROSCOPIC SALPINGO OOPHERECTOMY  Patient Location: PACU  Anesthesia Type: General  Level of Consciousness: awake and alert   Airway and Oxygen Therapy: Patient Spontanous Breathing  Post-op Pain: mild  Post-op Assessment: Post-op Vital signs reviewed, Patient's Cardiovascular Status Stable, Respiratory Function Stable, Patent Airway and No signs of Nausea or vomiting  Post-op Vital Signs: stable  Complications: No apparent anesthesia complications

## 2011-01-22 NOTE — Progress Notes (Signed)
Pt up ambulated in hall with mild vertigo.  Pt tolerated well with standby assistance.  Pt  Ambulated to bathroom and voided moderated amount clear yellow urine.

## 2011-01-22 NOTE — H&P (Addendum)
Tiffany Park, Park ACCOUNT NO.: [id number]  MEDICAL RECORD NO.: [id number]  LOCATION: GYN FACILITY: [facility]  PHYSICIAN: Melvine Julin A. Duard Brady, MD DATE OF BIRTH: 03/04/1960  DATE OF CONSULTATION: 12/12/2010  DATE OF DISCHARGE:  CONSULTATION  REFERRING PHYSICIAN: Dr. Leitha Schuller.  HISTORY: Patient is seen today in consultation at the request of Dr.  Jennette Kettle. [name] is a very pleasant 50 year old, gravida 3, para 2,  whose last period was approximately 2 weeks ago. She is on estrogen  with q.71-month withdrawal with Provera. She underwent an ultrasound to  evaluate her cycle thickness on September 12 of the year 2012. Findings  at that time revealed a retroverted uterus without evidence of the mass.  The right ovary appeared normal. There was a small amount of free fluid  in the area of the right adnexa. Within the left ovary, there was a  septated complex cystic mass with a solid component. The cystic area  had no blood flow and measured 1.5 x 1.1 x 0.9 cm. The solid component  had no blood flow and measured 1 x 0.7 cm. The differential as stated  by the radiology tech was a mucinous cystadenoma versus an ovarian  neoplasm. The left adnexa was otherwise normal. She did have an OVA1  test performed with the result of 4.5 with a premenopausal normal being  5 and a postmenopausal normal being greater than 4.4. She comes in  today. She is overall completely asymptomatic and states that the only  reason this was done was because of the ultrasound and not that she had  any discomfort whatsoever. Her last cycle was 2 weeks ago. Her cycles  were every 3-6 weeks. Occasionally, she will withdraw with her  progesterone, but also has significant spontaneous menses. She denies  any hot flashes. She has occasional night sweats. She has noticed some  increasing cramping with her cycles, but she felt that was related to  age. She does denies any bloating with exception of about a year ago.  She was diagnosed with  some gluten intolerance and if she is not 100%  compliant with her diet, she will have some bloating. She otherwise  denies any early satiety or other symptoms.  PAST MEDICAL HISTORY:  1. History of hyperthyroidism treated 15-16 years ago with radiation  therapy, now clinically hypothyroid.  2. Gluten intolerance.  3. Raynaud's.  4. Scleroderma.  PAST SURGICAL HISTORY:  1. She had tonsillectomy.  2. She had a D and C after missed AB.  FAMILY HISTORY: Significant for coronary disease and hypertension in  her grandmother. She had an aunt who died of cancer, that was lung  cancer, she was a smoker.  HEALTH MAINTENANCE: She is due for her mammogram in November. Her Pap  smears are up to date with Dr. Jennette Kettle. She had a colonoscopy and EGD that  were both negative.  SOCIAL HISTORY: She denies use of tobacco. She drinks alcohol  occasionally. 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  PHYSICAL EXAMINATION: VITAL SIGNS: Weight 98 pounds, height 5 feet 2  inches, blood pressure 182/60, pulse 58, respirations 14, temperature  98.  GENERAL: Well-nourished, well-developed female in no acute distress.  NECK: Supple. There is no lymphadenopathy. No thyromegaly.  LUNGS: Clear to auscultation bilaterally.  CARDIOVASCULAR: Regular rate and rhythm.  ABDOMEN: Soft, nontender, nondistended. No palpable masses or  hepatosplenomegaly. Groins are negative for adenopathy.  EXTREMITIES: No edema.  PELVIC: External genitalia is within normal  limits.  BIMANUAL EXAMINATION: The cervix is palpably normal. The corpus is  retroflexed, otherwise normal size, shape, consistency. There are no  adnexal masses.  ASSESSMENT: 50 year old with a complex 1-cm adnexal mass involving the  left ovary with an OVA1 that was 4.5, which is normal in a premenopausal female and  while she is on HRT, she is still functionally premenopausal as she has  spontaneous menses. I discussed these  results with her. We had a  lengthy discussion regarding proceeding with surgery for removal of the  adnexa on frozen section. We discussed the possibility of performing  hysterectomy and bilateral salpingo-oophorectomy, or performing a  unilateral salpingo-oophorectomy or even close followup, as her HE4 is  negative and it can be falsely elevated due to her autoimmune disorders.  At this point, she is not sure how she would like to proceed. She would  like to speak with her husband and call me back. She also states her  husband may want to speak with me on the telephone. She was offered  several surgical dates including 2 at [facility] on October 26th and  November 6th as the soonest that we have available; however, she states  that she does not feel like this is urgent and would be much better for  her to have surgery here than in [place], at which point, I offered  her the date of November 13th with me in [place]. She will consider  both of these options and will call me back.  Addendum:  I had the opportunity to speak with the patient's husband and they would  like to proceed with laparoscopic USO. The date that best fits their  schedule is November 27th 2012 and we will schedule her surgery accordinggly.  Tailor Westfall A. Duard Brady, MD   Patient seen and examined. Risks and benefits of the procedure were discussed with the patient and she wishes to proceed.

## 2011-01-22 NOTE — Anesthesia Preprocedure Evaluation (Addendum)
Anesthesia Evaluation  Patient identified by MRN, date of birth, ID band Patient awake    Reviewed: Allergy & Precautions, H&P , NPO status , Patient's Chart, lab work & pertinent test results  Airway Mallampati: II TM Distance: >3 FB Neck ROM: Full    Dental No notable dental hx. (+) Teeth Intact   Pulmonary neg pulmonary ROS,  clear to auscultation  Pulmonary exam normal       Cardiovascular neg cardio ROS Regular Normal    Neuro/Psych Negative Neurological ROS  Negative Psych ROS   GI/Hepatic negative GI ROS, Neg liver ROS,   Endo/Other  Negative Endocrine ROS  Renal/GU negative Renal ROS  Genitourinary negative   Musculoskeletal negative musculoskeletal ROS (+)   Abdominal   Peds negative pediatric ROS (+)  Hematology negative hematology ROS (+)   Anesthesia Other Findings   Reproductive/Obstetrics negative OB ROS                          Anesthesia Physical Anesthesia Plan  ASA: I  Anesthesia Plan: General   Post-op Pain Management:    Induction: Intravenous  Airway Management Planned: Oral ETT  Additional Equipment:   Intra-op Plan:   Post-operative Plan: Extubation in OR  Informed Consent: I have reviewed the patients History and Physical, chart, labs and discussed the procedure including the risks, benefits and alternatives for the proposed anesthesia with the patient or authorized representative who has indicated his/her understanding and acceptance.   Dental advisory given  Plan Discussed with: CRNA  Anesthesia Plan Comments:         Anesthesia Quick Evaluation

## 2011-01-22 NOTE — Op Note (Signed)
PATIENT: [name] DATE OF BIRTH: December 10, 1960 ENCOUNTER DATE:    Preop Diagnosis:  Left adnexal mass, elevated tumor markers.  Postoperative Diagnosis: Cystadenoma   Surgery:L/S Left salpingo-oophorectomy  Surgeons:  Rejeana Brock A. Duard Brady, MD; Konrad Dolores, MD   Assistant: Telford Nab   Anesthesia: General   Estimated blood loss: minimal  IVF: 1800 ml   Urine output: 125 ml   Complications: None   Pathology: Left tube and ovary  Operative findings: Normal uterus. Left adnexal mass 2 cm  Procedure: The patient was identified in the preoperative holding area. Informed consent was signed on the chart. Patient was seen history was reviewed and exam was performed.   The patient was then taken to the operating room and placed in the supine position with SCD hose on. General anesthesia was then induced without difficulty. She was then placed in the dorsolithotomy position. Her arms were tucked at her side with appropriate precautions on the gel pad. Shoulder blocks were then placed in the usual fashion with appropriate precautions. A OG-tube was placed to suction. First timeout was performed to confirm the patient procedure.  The perineum was then prepped in the usual fashion with Betadine. A 14 French Foley was inserted into the bladder under sterile conditions. A sterile speculum was placed in the vagina. The cervix was without lesions. The cervix was grasped with a single-tooth tenaculum.The abdomen was then prepped with 1 Chlor prep sponge per protocol.   Patient was then draped after the prep was dried. Second timeout was performed to confirm the above. After again confirming OG tube placement and it was to suction. A 12 mm incision was made in the infraumbilical portion of the abdomen and under direct visualization, at 10/12 optiview port was placed. The abdomen was insufflated with CO2 gas. At this point all points during the procedure the patient's intra-abdominal pressure did not  increased over 15 mm of mercury. The patient was then placed in Trendelenburg position. Bilateral 5 mm ports replaced 2 cm medial and above the ACIS.  A 5 mm suprapubic port was then placed.  Abdominal pelvic washings were obtained after assuring that the abdominal survey revealed no evidence of metastatic disease.  The posterior leaf of the broad ligament on the patient's left side was opened. The ureter was identified. A window made between IP and the ureter. The IP ligament was then coagulated with bipolar cautery. It was then transected. The utero-ovarian ligament was then cauterized with bipolar cautery and transected.  The ovarian mass was placed an Endo catch bag and delivered through the 10 port.  Frozen section. 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 At this we should was adequate hemostasis. All pedicles were noted to be hemostatic. The CO2 gas was removed from the patient's  abdomen and trochars were removed.  The fascia was closed using a 0 Vicryl on a UR 6. The skin was closed using 4 Vicryl. Steri-Strips were applied.  All instrument needle and Ray-Tec counts were correct x2. The Foley catheter was removed about difficulty. The patient was taken for recovery room in stable condition. All instrument needle and Ray-Tec counts were correct x2. The patient tolerated the procedure well and was taken to the recovery room in stable condition. This is Cleda Mccreedy dictating an operative note on patient Tiffany Park.

## 2011-01-22 NOTE — Interval H&P Note (Signed)
History and Physical Interval Note:   01/22/2011   10:07 AM   Tiffany Park  has presented today for surgery, with the diagnosis of ovarian cyst  The various methods of treatment have been discussed with the patient and family. After consideration of risks, benefits and other options for treatment, the patient has consented to  Procedure(s): LAPAROSCOPIC SALPINGO OOPHERECTOMY as a surgical intervention .  The patients' history has been reviewed, patient examined, no change in status, stable for surgery.  I have reviewed the patients' chart and labs.  Questions were answered to the patient's satisfaction.     Cleda Mccreedy A.  MD

## 2011-01-22 NOTE — Preoperative (Signed)
Beta Blockers   Reason not to administer Beta Blockers:Not Applicable 

## 2011-01-24 ENCOUNTER — Other Ambulatory Visit (HOSPITAL_COMMUNITY): Payer: BC Managed Care – PPO

## 2011-02-12 ENCOUNTER — Encounter: Payer: Self-pay | Admitting: Gynecologic Oncology

## 2011-02-13 ENCOUNTER — Ambulatory Visit: Payer: BC Managed Care – PPO | Attending: Gynecologic Oncology | Admitting: Gynecologic Oncology

## 2011-02-13 ENCOUNTER — Encounter: Payer: Self-pay | Admitting: Gynecologic Oncology

## 2011-02-13 VITALS — BP 88/52 | HR 68 | Temp 97.6°F | Resp 18 | Ht 62.0 in | Wt 97.7 lb

## 2011-02-13 DIAGNOSIS — K900 Celiac disease: Secondary | ICD-10-CM | POA: Insufficient documentation

## 2011-02-13 DIAGNOSIS — I7300 Raynaud's syndrome without gangrene: Secondary | ICD-10-CM | POA: Insufficient documentation

## 2011-02-13 DIAGNOSIS — Z79899 Other long term (current) drug therapy: Secondary | ICD-10-CM | POA: Insufficient documentation

## 2011-02-13 DIAGNOSIS — N838 Other noninflammatory disorders of ovary, fallopian tube and broad ligament: Secondary | ICD-10-CM

## 2011-02-13 DIAGNOSIS — N83209 Unspecified ovarian cyst, unspecified side: Secondary | ICD-10-CM | POA: Insufficient documentation

## 2011-02-13 DIAGNOSIS — Z9079 Acquired absence of other genital organ(s): Secondary | ICD-10-CM | POA: Insufficient documentation

## 2011-02-13 NOTE — Patient Instructions (Signed)
Return to clinic as needed

## 2011-02-13 NOTE — Progress Notes (Signed)
Consult Note: Gyn-Onc  [name] 50 y.o. female  CC:  Chief Complaint  Patient presents with  . Adnexal mass    Follow up    HPI: Patient is a very pleasant 50 year old who underwent an ultrasound in September of 2012. Findings revealed a retroverted uterus without evidence of a mass. Within the left ovary however there was a septated complex cystic mass with a solid component. The cystic area had no blood flow and measured 1.5 x 1.1 x 0.9 cm. The solid component had no blood flow and measured 1 x 0.7 cm. She did have an OV1 test with a result of 4.5. It was decided to proceed with a left salpingo-oophorectomy. She underwent a left salpingo-oophorectomy on November 27. Operative findings included a 3 cm left ovary. It was removed without difficulty. Pathology returned as a benign follicular cyst.  She has done well postoperatively and comes in today for a postoperative check. She states for the first 3 weeks she was fine since then she's not really been sleeping well she has been having difficulty sleeping for quite some time as recently salvageable sleeping falling asleep as well as an easy awakening and difficulty falling back asleep at night. As stated this has been going on for about a week. She denies any hot flashes. She states that this has occurred in the past before taking her estrogen patch. She's been using her estrogen patch without issue.  Interval History:   Review of Systems  Current Meds:  Outpatient Encounter Prescriptions as of 02/13/2011  Medication Sig Dispense Refill  . Calcium Citrate-Vitamin D (CALCIUM CITRATE + D PO) Take 3 tablets by mouth daily.       . diclofenac sodium (VOLTAREN) 1 % GEL Apply 1 application topically 2 (two) times daily as needed. PAIN       . estradiol (VIVELLE-DOT) 0.1 MG/24HR Place 1 patch onto the skin 2 (two) times a week. Tuesday AND Friday       . ibuprofen (ADVIL,MOTRIN) 200 MG tablet Take 600-800 mg by mouth every 6 (six) hours as  needed. PAIN       . levothyroxine (SYNTHROID, LEVOTHROID) 100 MCG tablet Take 100 mcg by mouth 3 (three) times a week. Tuesday, Thursday, AND SATURDAYS      . nitroGLYCERIN (NITRODUR - DOSED IN MG/24 HR) 0.2 mg/hr Apply 1/2 patch to affected area daily.  Change patch every 24 hours.  30 patch  6  . progesterone (PROMETRIUM) 100 MG capsule Every 3 months.      . SYNTHROID 88 MCG tablet Take 88 mcg by mouth 4 (four) times a week. Monday, Wednesday, Friday, AND Sunday.      Marland Kitchen oxyCODONE-acetaminophen (ROXICET) 5-325 MG per tablet Take 1 tablet by mouth every 4 (four) hours as needed for pain.  6 tablet  0    Allergy:  Allergies  Allergen Reactions  . Codeine Nausea And Vomiting    Social Hx:   History   Social History  . Marital Status: Married    Spouse Name: N/A    Number of Children: N/A  . Years of Education: N/A   Occupational History  . Not on file.   Social History Main Topics  . Smoking status: Never Smoker   . Smokeless tobacco: Never Used  . Alcohol Use: Yes     occasional, social  . Drug Use: No  . Sexually Active: Yes   Other Topics Concern  . Not on file   Social History Narrative  .  No narrative on file    Past Surgical Hx:  Past Surgical History  Procedure Date  . Tonsillectomy age 50  . Dilation and curettage of uterus 1990  . Colonscopy jan 2012  . Upper gastrointestinal endoscopy jan 2012  . Laparoscopic salpingoopherectomy 2012    left    Past Medical Hx:  Past Medical History  Diagnosis Date  . Gluten intolerance     cuaes diarrhea, stomach cramps  . Scleroderma   . Raynaud disease   . Adnexal mass     2012    Family Hx:  Family History  Problem Relation Age of Onset  . Hypertension Other   . Coronary artery disease Other   . Lung cancer Other     Vitals:  Blood pressure 88/52, pulse 68, temperature 97.6 F (36.4 C), temperature source Oral, resp. rate 18, height 5\' 2"  (1.575 m), weight 97 lb 11.2 oz (44.316 kg), last menstrual  period 12/11/2010.  Physical Exam:  Well-nourished well-developed female in no acute distress.  Abdomen: Well-healed surgical incisions. Postoperative changes in the umbilicus. Abdomen is soft and nontender. Assessment/Plan:  50 year old status post left salpingo-oophorectomy for a benign ovarian cyst. She was encouraged to massage the umbilical incision to help prevent scar formation. She'll be released from our clinic. She knows that we will be happy to see her in the future should the need arise. With regards her sleepy should continue to follow this expectantly. If it does not improve the next few weeks she'll follow up with Dr. Jennette Kettle. Cleda Mccreedy A., MD 02/13/2011, 3:12 PM

## 2011-03-05 ENCOUNTER — Ambulatory Visit: Payer: BC Managed Care – PPO | Admitting: Sports Medicine

## 2011-11-11 ENCOUNTER — Emergency Department (HOSPITAL_COMMUNITY): Payer: No Typology Code available for payment source

## 2011-11-11 ENCOUNTER — Encounter (HOSPITAL_COMMUNITY): Payer: Self-pay | Admitting: Emergency Medicine

## 2011-11-11 ENCOUNTER — Emergency Department (HOSPITAL_COMMUNITY)
Admission: EM | Admit: 2011-11-11 | Discharge: 2011-11-11 | Disposition: A | Discharge status: Home / Self Care | Payer: No Typology Code available for payment source | Attending: Emergency Medicine | Admitting: Emergency Medicine

## 2011-11-11 DIAGNOSIS — Z8249 Family history of ischemic heart disease and other diseases of the circulatory system: Secondary | ICD-10-CM | POA: Insufficient documentation

## 2011-11-11 DIAGNOSIS — S0003XA Contusion of scalp, initial encounter: Secondary | ICD-10-CM | POA: Insufficient documentation

## 2011-11-11 DIAGNOSIS — Y92410 Unspecified street and highway as the place of occurrence of the external cause: Secondary | ICD-10-CM | POA: Insufficient documentation

## 2011-11-11 DIAGNOSIS — M25512 Pain in left shoulder: Secondary | ICD-10-CM

## 2011-11-11 DIAGNOSIS — Z885 Allergy status to narcotic agent status: Secondary | ICD-10-CM | POA: Insufficient documentation

## 2011-11-11 DIAGNOSIS — M25519 Pain in unspecified shoulder: Secondary | ICD-10-CM | POA: Insufficient documentation

## 2011-11-11 DIAGNOSIS — Z801 Family history of malignant neoplasm of trachea, bronchus and lung: Secondary | ICD-10-CM | POA: Insufficient documentation

## 2011-11-11 MED ORDER — METHOCARBAMOL 500 MG PO TABS: 1000.0000 mg | ORAL_TABLET | Freq: Four times a day (QID) | ORAL | Status: DC | PRN

## 2011-11-11 MED ORDER — HYDROCODONE-ACETAMINOPHEN 5-325 MG PO TABS: ORAL_TABLET | ORAL | Status: DC

## 2011-11-11 MED ORDER — ACETAMINOPHEN 500 MG PO TABS
1000.0000 mg | ORAL_TABLET | Freq: Once | ORAL | Status: AC
  Administered 2011-11-11: 1000 mg via ORAL
  Filled 2011-11-11: qty 2

## 2011-11-11 MED ORDER — IBUPROFEN 400 MG PO TABS
400.0000 mg | ORAL_TABLET | Freq: Once | ORAL | Status: AC
  Administered 2011-11-11: 400 mg via ORAL
  Filled 2011-11-11: qty 1

## 2011-11-11 MED ORDER — NAPROXEN 250 MG PO TABS: 250.0000 mg | ORAL_TABLET | Freq: Two times a day (BID) | ORAL | Status: DC
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
## 2012-01-28 ENCOUNTER — Encounter: Payer: Self-pay | Admitting: Sports Medicine

## 2012-01-28 ENCOUNTER — Ambulatory Visit (INDEPENDENT_AMBULATORY_CARE_PROVIDER_SITE_OTHER): Payer: Self-pay | Admitting: Sports Medicine

## 2012-01-28 VITALS — BP 99/71 | HR 76 | Ht 62.0 in | Wt 100.0 lb

## 2012-01-28 DIAGNOSIS — M549 Dorsalgia, unspecified: Secondary | ICD-10-CM
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
## 2012-01-29 ENCOUNTER — Encounter: Payer: Self-pay | Admitting: Sports Medicine

## 2012-02-05 ENCOUNTER — Ambulatory Visit (INDEPENDENT_AMBULATORY_CARE_PROVIDER_SITE_OTHER): Payer: BC Managed Care – PPO | Admitting: Sports Medicine

## 2012-02-05 ENCOUNTER — Encounter: Payer: Self-pay | Admitting: Sports Medicine

## 2012-02-05 VITALS — BP 107/68 | HR 66 | Ht 62.0 in | Wt 100.0 lb

## 2012-02-05 DIAGNOSIS — M549 Dorsalgia, unspecified: Secondary | ICD-10-CM

## 2012-02-05 DIAGNOSIS — M999 Biomechanical lesion, unspecified: Secondary | ICD-10-CM

## 2012-02-05 DIAGNOSIS — M2170 Unequal limb length (acquired), unspecified site: Secondary | ICD-10-CM

## 2012-02-05 MED ORDER — CYCLOBENZAPRINE HCL 10 MG PO TABS: 10.0000 mg | ORAL_TABLET | Freq: Every evening | ORAL | Status: DC | PRN

## 2012-02-05 NOTE — Assessment & Plan Note (Addendum)
Mechanical in nature and likely secondary to muscle imbalances. Patient will continue home exercise program which includes psoas stretches. Patient did respond very well to osteopathic manipulation therapy today and will followup with this as well. Patient also has a leg length discrepancy which I think is contributing. Flexeril each bedtime as needed. Followup in 2-3 weeks.

## 2012-02-05 NOTE — Assessment & Plan Note (Signed)
Decision today to treat with OMT was based on Physical Exam  After verbal consent patient was treated with HVLA and ME techniques in thoracic and lumbar areas  Patient tolerated the procedure well with improvement in symptoms  Patient given exercises, stretches and lifestyle modifications  See medications in patient instructions if given  Patient will follow up in 2-3 weeks
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
## 2012-02-17 ENCOUNTER — Ambulatory Visit: Payer: BC Managed Care – PPO | Admitting: Sports Medicine

## 2012-02-18 ENCOUNTER — Ambulatory Visit: Payer: BC Managed Care – PPO | Admitting: Sports Medicine

## 2012-03-03 ENCOUNTER — Encounter: Payer: Self-pay | Admitting: Family Medicine

## 2012-03-03 ENCOUNTER — Ambulatory Visit (INDEPENDENT_AMBULATORY_CARE_PROVIDER_SITE_OTHER): Payer: BC Managed Care – PPO | Admitting: Family Medicine

## 2012-03-03 VITALS — BP 100/66 | HR 75 | Ht 62.0 in | Wt 100.0 lb

## 2012-03-03 DIAGNOSIS — M999 Biomechanical lesion, unspecified: Secondary | ICD-10-CM

## 2012-03-03 NOTE — Assessment & Plan Note (Signed)
Decision today to treat with OMT was based on Physical Exam  After verbal consent patient was treated with HVLA and ME techniques in thoracolumbar and sacral areas  Patient tolerated the procedure well with improvement in symptoms  Patient given exercises, stretches and lifestyle modifications  See medications in patient instructions if given  Patient will follow up in 4 weeks  Patient was also given exercises for developing piriformis syndrome. I think though with what she is doing likely are improving. If the piriformis seems to be more trouble then I would consider stopping the heel lift but do not think this will be a problem.
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
## 2012-03-04 ENCOUNTER — Ambulatory Visit: Payer: BC Managed Care – PPO | Admitting: Sports Medicine

## 2012-03-10 ENCOUNTER — Ambulatory Visit (INDEPENDENT_AMBULATORY_CARE_PROVIDER_SITE_OTHER): Payer: BC Managed Care – PPO | Admitting: Family Medicine

## 2012-03-10 ENCOUNTER — Telehealth: Payer: Self-pay | Admitting: Family Medicine

## 2012-03-10 VITALS — BP 80/54 | Ht 62.0 in | Wt 100.0 lb

## 2012-03-10 DIAGNOSIS — M999 Biomechanical lesion, unspecified: Secondary | ICD-10-CM

## 2012-03-10 MED ORDER — KETOROLAC TROMETHAMINE 30 MG/ML IJ SOLN
30.0000 mg | Freq: Once | INTRAMUSCULAR | Status: AC
  Administered 2012-03-10: 30 mg via INTRAMUSCULAR

## 2012-03-10 MED ORDER — DIAZEPAM 5 MG PO TABS: 5.0000 mg | ORAL_TABLET | Freq: Two times a day (BID) | ORAL | Status: DC | PRN

## 2012-03-10 NOTE — Addendum Note (Signed)
Addended by: Jacki Cones C on: 03/10/2012 04:07 PM   Modules accepted: Orders

## 2012-03-10 NOTE — Progress Notes (Signed)
Patient is returning with the muscle spasm after patient's last manipulation. Patient has been seen for cervical and thoracic pain and has responded very well to osteopathic manipulation at this point. Patient states that she's been having pain more on the left sided rib cage. Patient states that this does hurt somewhat more with breathing and certain movements. Patient has tried anti-inflammatories as well as ice, heat and massage with minimal benefit. Patient states that this pain is also waking her up throughout the night. Patient denies any new medications, denies any fever chills or any shortness of breath.  14 system review was done and unremarkable as related to the orthopedic problems.  Physical exam Blood pressure 80/54, height 5\' 2"  (1.575 m), weight 100 lb (45.36 kg). General: No apparent distress alert and oriented x3 mood and affect normal Back exam: Patient does have a muscle spasm from T6-T8 9 of the paraspinal musculature on the left side. This is very tender to palpation. Patient also does have a inhaled eighth rib. T8 is extended rotated and side bent left L1 is flexed rotated and side bent left

## 2012-03-10 NOTE — Patient Instructions (Signed)
I am sorry you had this flare. I do think it's a muscle spasm. I hope this will continue to improve over the course of the day. I'm giving you a shot of Toradol which I think will be helpful. I'm also giving you some Valium to help as a muscle relaxant. Be careful this will probably not her socks off. I will see you again in 2 weeks.

## 2012-03-10 NOTE — Assessment & Plan Note (Signed)
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 Patient was manipulated today with some moderate benefit and decrease pain subjectively and objectively. Patient given a shot of portal today to decrease any inflammation. Patient given prescription for Valium to take on an as-needed basis. Patient will followup in 2 weeks for further evaluation. If patient does not have significant improvement I would consider getting a rib x-ray series and potentially a chest x-ray.

## 2012-03-10 NOTE — Telephone Encounter (Signed)
Called patient at this time and I will have her come in and be seen. Patient will be coming in next 20 minutes now see if there's anything I can do with manipulation.

## 2012-03-10 NOTE — Telephone Encounter (Signed)
Message copied by Judi Saa on Tue Mar 10, 2012  1:26 PM ------      Message from: Annita Brod      Created: Tue Mar 10, 2012  1:25 PM      Regarding: FW: vm message      Contact: [phone]                   ----- Message -----         From: Lizbeth Bark         Sent: 03/10/2012   9:20 AM           To: Lillia Pauls, CMA      Subject: vm message                                               Pt left message stating after her manipulation with Dr. Katrinka Blazing she is having pain in a different area. Would like to discuss to Dr.Malesha Suliman regarding it.

## 2012-03-18 ENCOUNTER — Ambulatory Visit: Payer: BC Managed Care – PPO | Admitting: Sports Medicine

## 2012-03-24 ENCOUNTER — Ambulatory Visit (INDEPENDENT_AMBULATORY_CARE_PROVIDER_SITE_OTHER): Payer: BC Managed Care – PPO | Admitting: Family Medicine

## 2012-03-24 VITALS — BP 90/54 | Ht 62.0 in | Wt 100.0 lb

## 2012-03-24 DIAGNOSIS — M999 Biomechanical lesion, unspecified: Secondary | ICD-10-CM

## 2012-03-24 DIAGNOSIS — M549 Dorsalgia, unspecified: Secondary | ICD-10-CM

## 2012-03-24 MED ORDER — DIAZEPAM 5 MG PO TABS: 5.0000 mg | ORAL_TABLET | Freq: Two times a day (BID) | ORAL | Status: DC | PRN
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
## 2012-03-24 NOTE — Assessment & Plan Note (Signed)
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 Patient was manipulated today with moderate benefit and decrease pain. Patient given prescription for Valium to take on an as-needed basis. Patient will followup in 3-4 weeks for further evaluation. If patient does not have significant improvement I would consider getting a rib x-ray series and potentially a chest x-ray.
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
## 2012-04-01 ENCOUNTER — Other Ambulatory Visit: Payer: Self-pay | Admitting: *Deleted

## 2012-04-01 MED ORDER — MELOXICAM 15 MG PO TABS: 15.0000 mg | ORAL_TABLET | Freq: Every day | ORAL | Status: DC

## 2012-04-01 NOTE — Progress Notes (Signed)
Pt called requesting rx.  Sent rx per dr. Darrick Penna.

## 2012-04-15 ENCOUNTER — Ambulatory Visit (INDEPENDENT_AMBULATORY_CARE_PROVIDER_SITE_OTHER): Payer: BC Managed Care – PPO | Admitting: Sports Medicine

## 2012-04-15 VITALS — BP 108/64 | Ht 62.0 in | Wt 100.0 lb

## 2012-04-15 DIAGNOSIS — M999 Biomechanical lesion, unspecified: Secondary | ICD-10-CM

## 2012-04-15 NOTE — Assessment & Plan Note (Addendum)
Decision today to treat with OMT was based on Physical Exam  After verbal consent patient was treated with ME and articular  techniques in thoracic and lumbar areas  Patient tolerated the procedure well with improvement in symptoms  Patient given exercises, stretches and lifestyle modifications  See medications in patient instructions if given  Patient will follow up in 3-4 weeks Will continue PT for now Consider trigger point injections at follow up.

## 2012-04-15 NOTE — Progress Notes (Signed)
Patient is here for followup of her midthoracic back pain and low back pain. Patient has been responding somewhat to osteopathic manipulation and has started formal physical therapy. Patient states that she is feeling better but still has the thoracic pain on the left side. Patient denies any radiation of pain and states that it hurts to touch and certain movements. Patient has tried numerous allergies including kinesiology tape with minimal improvement. Patient does state though that the exercises to try to strengthen the upper back seems to be helping somewhat. Patient denies any new symptoms such as fever, chills, numbness or tingling in the upper extremity is.  Past medical history, social, surgical and family history all reviewed.   Physical exam Blood pressure 108/64, height 5\' 2"  (1.575 m), weight 100 lb (45.36 kg). Osteopathic manipulation findings Cervical: Neutral Thoracic:  T3 extended rotated and side bent left T7 extended rotated and side bent right T12 flexed rotated and side bent left Lumbar L1 flexed rotated and side bent left Sacrum Left on left

## 2012-04-23 ENCOUNTER — Other Ambulatory Visit: Payer: Self-pay | Admitting: *Deleted

## 2012-04-23 DIAGNOSIS — M549 Dorsalgia, unspecified: Secondary | ICD-10-CM

## 2012-04-23 DIAGNOSIS — M999 Biomechanical lesion, unspecified: Secondary | ICD-10-CM

## 2012-05-05 ENCOUNTER — Ambulatory Visit (INDEPENDENT_AMBULATORY_CARE_PROVIDER_SITE_OTHER): Payer: BC Managed Care – PPO | Admitting: Family Medicine

## 2012-05-05 VITALS — BP 90/50 | Ht 62.0 in | Wt 100.0 lb

## 2012-05-05 DIAGNOSIS — M999 Biomechanical lesion, unspecified: Secondary | ICD-10-CM

## 2012-05-05 NOTE — Assessment & Plan Note (Signed)
Decision today to treat with OMT was based on Physical Exam  After verbal consent patient was treated with HVLA, ME techniques in thoracic and cervical areas  Patient tolerated the procedure well with improvement in symptoms  Patient given exercises, stretches and lifestyle modifications. Patient is doing very well but told to potential looked into by a new mattress that could be beneficial.  Patient stopped her meloxicam and will take ibuprofen as needed.  Patient will follow up in 6-8 weeks

## 2012-05-05 NOTE — Progress Notes (Signed)
Patient is here for followup of her midthoracic back pain and low back pain. Patient states that she is doing significantly better after her osteopathic manipulation and formal physical therapy. At this time she is doing a home exercise program he continues to improve. Patient states at that midthoracic pain still gives her some mild trouble with tennis but overall seems to be improving. Patient is wondering if some of this could be contributing to her old mattress which is greater than 52 years of age. Patient denies any radiation or any new symptoms such as numbness or weakness in any of her extremities. Patient also denies any bowel or bladder incontinence. The patient has stopped her meloxicam is taking ibuprofen as needed. Patient continues to ice as needed. Overall she would say she is approximately 70-80% improved.  Past medical history, social, surgical and family history all reviewed.   Physical exam Blood pressure 90/50, height 5\' 2"  (1.575 m), weight 100 lb (45.36 kg). General: No apparent distress alert and oriented x3 mood and affect normal.  Osteopathic manipulation findings  Cervical:  C2 F RS left C4 F RS right   Thoracic:  T3 extended rotated and side bent left  T7 extended rotated and side bent right   Lumbar  Neutral  Sacrum  Left on left

## 2012-06-10 ENCOUNTER — Ambulatory Visit (INDEPENDENT_AMBULATORY_CARE_PROVIDER_SITE_OTHER): Payer: BC Managed Care – PPO | Admitting: Family Medicine

## 2012-06-10 ENCOUNTER — Encounter: Payer: Self-pay | Admitting: Family Medicine

## 2012-06-10 ENCOUNTER — Ambulatory Visit: Payer: BC Managed Care – PPO | Admitting: Family Medicine

## 2012-06-10 VITALS — BP 104/68 | HR 53 | Ht 62.0 in | Wt 100.0 lb

## 2012-06-10 DIAGNOSIS — M549 Dorsalgia, unspecified: Secondary | ICD-10-CM

## 2012-06-10 DIAGNOSIS — M999 Biomechanical lesion, unspecified: Secondary | ICD-10-CM

## 2012-06-10 MED ORDER — GABAPENTIN 100 MG PO CAPS: 100.0000 mg | ORAL_CAPSULE | Freq: Every day | ORAL | Status: DC
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
## 2012-06-10 NOTE — Progress Notes (Signed)
The patient is here for followup of her mid thoracic back pain and low back pain. Patient states that she is doing significantly better. Patient did have an exacerbation to approximately 2 weeks after our last visit she did go to a neurosurgeon for further evaluation. They did review her MRI and stated that this did seem to be unremarkable as well. Patient is not taking any new medications at this time but continues to take ibuprofen on an as-needed basis. Patient has started using yoga class which has also made a good improvement. Patient states yesterday though she did do some weightlifting and is having a little bit more pain but she is still able to play tennis and do all her other activities of daily living.  Past medical history, social, surgical and family history all reviewed.   Physical exam Blood pressure 104/68, pulse 53, height 5\' 2"  (1.575 m), weight 100 lb (45.36 kg). General: No apparent distress alert and oriented x3 mood and affect normal.  Osteopathic manipulation findings  Cervical:  C2 F RS left   Thoracic:  T3 extended rotated and side bent left  T7 extended rotated and side bent right  Lumbar  L1 F RS left Sacrum  Left on left
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
## 2012-06-11 ENCOUNTER — Other Ambulatory Visit: Payer: Self-pay | Admitting: *Deleted

## 2012-06-11 DIAGNOSIS — M999 Biomechanical lesion, unspecified: Secondary | ICD-10-CM

## 2012-06-11 DIAGNOSIS — M549 Dorsalgia, unspecified: Secondary | ICD-10-CM

## 2012-06-11 DIAGNOSIS — M2170 Unequal limb length (acquired), unspecified site: Secondary | ICD-10-CM

## 2012-06-16 ENCOUNTER — Ambulatory Visit: Payer: BC Managed Care – PPO | Admitting: Family Medicine

## 2012-06-30 ENCOUNTER — Other Ambulatory Visit: Payer: Self-pay | Admitting: *Deleted

## 2012-06-30 MED ORDER — DICLOFENAC SODIUM 1 % TD GEL: 2.0000 g | Freq: Two times a day (BID) | TRANSDERMAL | Status: DC | PRN

## 2012-07-21 ENCOUNTER — Encounter: Payer: Self-pay | Admitting: Family Medicine

## 2012-07-21 ENCOUNTER — Ambulatory Visit (INDEPENDENT_AMBULATORY_CARE_PROVIDER_SITE_OTHER): Payer: BC Managed Care – PPO | Admitting: Family Medicine

## 2012-07-21 VITALS — BP 101/66 | HR 56 | Ht 62.0 in | Wt 100.0 lb

## 2012-07-21 DIAGNOSIS — M999 Biomechanical lesion, unspecified: Secondary | ICD-10-CM

## 2012-07-21 NOTE — Assessment & Plan Note (Signed)
Decision today to treat with OMT was based on Physical Exam  After verbal consent patient was treated with HVLA and ME techniques in thoracic lumbar and cervical areas  Patient tolerated the procedure well with improvement in symptoms  Patient given exercises, stretches and lifestyle modifications, encouraged to continue home exercises at least 3 times weekly.   See medications in patient instructions if given  Patient will follow up in 8-10 weeks

## 2012-07-21 NOTE — Progress Notes (Signed)
The patient is here for followup of her mid thoracic back pain and low back pain. Patient states that she is doing significantly better over the coarse of last 6 weeks.  She has been playing tennis on regular basis and doing Yoga almost daily focusing on core strength. Patient is not taking any new medications at this time.  Patient states this is the best she has felt in a long time.  Feels though her rehab is a full time job. No new symptoms.  Here for further OMT tx.    Past medical history, social, surgical and family history all reviewed.  Physical exam  Blood pressure 101/66, pulse 56, height 5\' 2"  (1.575 m), weight 100 lb (45.36 kg).  General: No apparent distress alert and oriented x3 mood and affect normal.  Osteopathic manipulation findings  Cervical:  C2 F RS left  Thoracic:  T3 extended rotated and side bent left  T7 extended rotated and side bent right  Lumbar  L1 F RS left  L4 F RS right Sacrum  Left on left

## 2012-08-18 ENCOUNTER — Ambulatory Visit (INDEPENDENT_AMBULATORY_CARE_PROVIDER_SITE_OTHER): Payer: BC Managed Care – PPO | Admitting: Family Medicine

## 2012-08-18 ENCOUNTER — Encounter: Payer: Self-pay | Admitting: Family Medicine

## 2012-08-18 VITALS — BP 94/60 | Ht 62.0 in | Wt 100.0 lb

## 2012-08-18 DIAGNOSIS — M999 Biomechanical lesion, unspecified: Secondary | ICD-10-CM

## 2012-08-18 NOTE — Assessment & Plan Note (Signed)
Decision today to treat with OMT was based on Physical Exam  After verbal consent patient was treated with HVLA, ME techniques in thoracic and lumbar areas  Patient tolerated the procedure well with improvement in symptoms  Patient given exercises, stretches and lifestyle modifications  See medications in patient instructions if given  Patient will follow up in 6 weeks

## 2012-08-18 NOTE — Progress Notes (Signed)
The patient is here for followup of her mid thoracic back pain and low back pain. Was doing well until 2 days ago when she was sitting on a couch that morning and felt a pop in her lower back. Patient states she did not have pain immediately but over the course of the last 48 hours it has increased tremendously. Patient states that it is very sore to touch but denies any radiation or any numbness. Patient has been continue to do her regular exercises including core strengthening. Patient was doing yoga 5 times a week does notice she was having more pain as well. Patient is now back that off to 3 times a week.  Patient is not taking any new medications at this time.  Here for further OMT tx.    Past medical history, social, surgical and family history all reviewed.  Physical exam  Blood pressure 94/60, height 5\' 2"  (1.575 m), weight 100 lb (45.36 kg).  General: No apparent distress alert and oriented x3 mood and affect normal.  Osteopathic manipulation findings  Cervical:  C2 F RS left  Thoracic:  T7 extended rotated and side bent right  Lumbar  L1 F RS left  Sacrum  Left on left

## 2012-09-07 ENCOUNTER — Other Ambulatory Visit: Payer: Self-pay | Admitting: *Deleted

## 2012-09-07 DIAGNOSIS — M549 Dorsalgia, unspecified: Secondary | ICD-10-CM

## 2012-10-15 ENCOUNTER — Encounter: Payer: Self-pay | Admitting: Family Medicine

## 2012-10-15 ENCOUNTER — Ambulatory Visit (INDEPENDENT_AMBULATORY_CARE_PROVIDER_SITE_OTHER): Payer: BC Managed Care – PPO | Admitting: Family Medicine

## 2012-10-15 VITALS — BP 98/64 | HR 60 | Wt 101.0 lb

## 2012-10-15 DIAGNOSIS — M999 Biomechanical lesion, unspecified: Secondary | ICD-10-CM | POA: Insufficient documentation

## 2012-10-15 DIAGNOSIS — M9981 Other biomechanical lesions of cervical region: Secondary | ICD-10-CM

## 2012-10-15 DIAGNOSIS — M549 Dorsalgia, unspecified: Secondary | ICD-10-CM

## 2012-10-15 MED ORDER — DIAZEPAM 5 MG PO TABS: 5.0000 mg | ORAL_TABLET | Freq: Two times a day (BID) | ORAL | Status: DC | PRN

## 2012-10-15 NOTE — Assessment & Plan Note (Signed)
Decision today to treat with OMT was based on Physical Exam  After verbal consent patient was treated with HVLA, ME techniques in cervical,  thoracic and lumbar areas  Patient tolerated the procedure well with improvement in symptoms  Patient given exercises, stretches and lifestyle modifications  See medications in patient instructions if given Follow up in 4-6 weeks.

## 2012-10-15 NOTE — Assessment & Plan Note (Signed)
Decision today to treat with OMT was based on Physical Exam  After verbal consent patient was treated with HVLA, ME techniques in cervical,  thoracic and lumbar areas  Patient tolerated the procedure well with improvement in symptoms  Patient given exercises, stretches and lifestyle modifications  See medications in patient instructions if given

## 2012-10-15 NOTE — Progress Notes (Signed)
  Subjective:    CC: Back and neck pain  HPI: Patient is a very pleasant 52 year old female who is seen prior to starting practice here for osteopathic manipulation. Patient has been doing fairly well but is starting to have worsening symptoms in her neck. Patient does have a known cervical disc disease previously. Patient states that the pain is mostly on the left side and radiates down to her left shoulder. Patient has tried Valium which has been helpful. Patient denies any weakness or numbness in the hand. Patient does notice it when she wakes up at night and has been sleeping with her hand over her pillow with a fan on her. Patient's low back has been improving. Patient has had trouble with tight hip flexors and quadratus lumborum for quite some time. Patient has been going to another physical therapist as well as a dry needling which has been helpful. Have not seen patient for greater than 6 weeks and states that she is having more discomfort on the right side, denies radiation down the leg, denies numbness or tingling. Describes the pain is the same dull aching sensation that she usually has.  Past medical history, Surgical history, Family history not pertinant except as noted below, Social history, Allergies, and medications have been entered into the medical record, reviewed, and no changes needed.   Review of Systems: No fevers, chills, night sweats, weight loss, chest pain, or shortness of breath.   Objective:   Blood pressure 98/64, pulse 60, weight 101 lb (45.813 kg), SpO2 97.00%.  General: Well Developed, well nourished, and in no acute distress.  Neuro: Alert and oriented x3, extra-ocular muscles intact, sensation grossly intact.  HEENT: Normocephalic, atraumatic, pupils equal round reactive to light, neck supple, no masses, no lymphadenopathy, thyroid nonpalpable.  Skin: Warm and dry, no rashes. Cardiac:  no lower extremity edema. Respiratory: Not using accessory muscles, speaking  in full sentences. Abdominal: NT, soft Gait: Nonantlagic, good balance and coordination Lymphatic: no lymphadenopathy in neck or axillae on palpation, non tender.  Musculoskeletal: Inspection and palpation of the right and left upper extremities including the shoulders elbows and wrist are unremarkable with full range of motion and good muscle strength and tone. Inspection and palpation of the right and left lower extremities including the hips knees and ankles are unremarkable and nontender with full range of motion and good muscle strength and tone and are symmetric. Osteopathic manipulation findings  Cervical:  C2 F RS left  C7 flexed rotated and side bent left Thoracic:  Elevated first rib on left T3 extended rotated inside that right T7 extended rotated and side bent right  Lumbar  L1 F RS left  Sacrum  Left on left   Impression and Recommendations:

## 2012-10-15 NOTE — Assessment & Plan Note (Signed)
Decision today to treat with OMT was based on Physical Exam  After verbal consent patient was treated with HVLA, ME techniques in cervical, thoracic and lumbar areas  Patient tolerated the procedure well with improvement in symptoms  Patient given exercises, stretches and lifestyle modifications  See medications in patient instructions if given  Patient will follow up in 6 weeks        

## 2012-10-15 NOTE — Assessment & Plan Note (Signed)
Mr. likely secondary to her tight hip flexors. Discussed home exercise program and give her more exercises that could be beneficial. Discussed footwear and other changes she can make during her daily activities. Still responded well to osteopathic manipulation. Will continue to followup 4- 6 week intervals.

## 2012-11-13 ENCOUNTER — Ambulatory Visit: Payer: BC Managed Care – PPO | Admitting: Family Medicine

## 2012-12-01 ENCOUNTER — Ambulatory Visit (INDEPENDENT_AMBULATORY_CARE_PROVIDER_SITE_OTHER): Payer: BC Managed Care – PPO | Admitting: Sports Medicine

## 2012-12-01 ENCOUNTER — Encounter: Payer: Self-pay | Admitting: Sports Medicine

## 2012-12-01 VITALS — BP 95/57 | Ht 62.0 in | Wt 100.0 lb

## 2012-12-01 DIAGNOSIS — M549 Dorsalgia, unspecified: Secondary | ICD-10-CM
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
## 2012-12-01 NOTE — Assessment & Plan Note (Signed)
The lumbar area of pain seems resolved although the quad lumborum remains tight and there does seem to be a slight compensatory scoliosis in lumbar region  Thoracic area of TTP corresponds to an area of remote tear to the oblique intercostalis mm in the area based on the findings of [place]  I think she has responded well to dry needling and PT Continue this  Trial on some local TX with capsin cream over area of injury qid x 1 month Avoid direct pressure over injury as this may be triggering the nerve irritation that she sometimes experience  Reck in 1 to 3 months depending response to PT

## 2012-12-01 NOTE — Patient Instructions (Addendum)
You have a small area of scar tissue in an intercostal muscle where you are having pain in your upper back  The nerve does not seem involved unless you put pressure on the scar tissue  Try rubbing capsaicin cream into the tender area of your back 4 times per day for 1 month  Continue dry needling if this is helpful  Continue exercise regimen   Please follow up in 1 month if not improving  Thank you for seeing [place] today!

## 2012-12-22 ENCOUNTER — Other Ambulatory Visit: Payer: BC Managed Care – PPO | Admitting: Sports Medicine

## 2013-01-06 ENCOUNTER — Other Ambulatory Visit: Payer: Self-pay | Admitting: Obstetrics & Gynecology

## 2013-01-20 ENCOUNTER — Encounter: Payer: Self-pay | Admitting: Sports Medicine

## 2013-01-20 ENCOUNTER — Ambulatory Visit (INDEPENDENT_AMBULATORY_CARE_PROVIDER_SITE_OTHER): Payer: BC Managed Care – PPO | Admitting: Sports Medicine

## 2013-01-20 VITALS — BP 95/65 | Ht 62.0 in | Wt 100.0 lb

## 2013-01-20 DIAGNOSIS — M79671 Pain in right foot: Secondary | ICD-10-CM

## 2013-01-20 DIAGNOSIS — M79609 Pain in unspecified limb: Secondary | ICD-10-CM

## 2013-01-20 DIAGNOSIS — M546 Pain in thoracic spine: Secondary | ICD-10-CM
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
## 2013-02-02 ENCOUNTER — Ambulatory Visit: Payer: BC Managed Care – PPO | Admitting: Sports Medicine

## 2013-03-23 ENCOUNTER — Ambulatory Visit (INDEPENDENT_AMBULATORY_CARE_PROVIDER_SITE_OTHER): Payer: BC Managed Care – PPO | Admitting: Sports Medicine

## 2013-03-23 DIAGNOSIS — M549 Dorsalgia, unspecified: Secondary | ICD-10-CM | POA: Diagnosis not present

## 2013-03-23 DIAGNOSIS — IMO0002 Reserved for concepts with insufficient information to code with codable children: Secondary | ICD-10-CM | POA: Diagnosis not present

## 2013-03-23 DIAGNOSIS — S76399A Other specified injury of muscle, fascia and tendon of the posterior muscle group at thigh level, unspecified thigh, initial encounter: Secondary | ICD-10-CM

## 2013-03-23 MED ORDER — NITROGLYCERIN 0.2 MG/HR TD PT24: MEDICATED_PATCH | TRANSDERMAL | Status: DC

## 2013-03-23 NOTE — Assessment & Plan Note (Signed)
Good response to injection of costo-vertebral joint  Spasm along paraspinous area of t 7 to T11 Less severe than before Local area of spasm  Procedure:  Injection of paraspinous mm spasm Consent obtained and verified. Time-out conducted. Noted no overlying erythema, induration, or other signs of local infection. Skin prepped in a sterile fashion. Topical analgesic spray: Ethyl chloride. Completed without difficulty. Meds: kenalog 10 + 2cc lidocaine Pain immediately improved suggesting accurate placement of the medication.  Scar tissue visaulized on US This was injected under visualization  Return to PT Reck 6 wks Advised to call if fevers/chills, erythema, induration, drainage, or persistent bleeding.

## 2013-03-23 NOTE — Assessment & Plan Note (Signed)
Acute flare  With hypochoic merge into muscle retraction will inject  Procedure:  Injection of left mid hamstring avulsion Consent obtained and verified. Time-out conducted. Noted no overlying erythema, induration, or other signs of local infection. Skin prepped in a sterile fashion. Topical analgesic spray: Ethyl chloride. Completed without difficulty. Meds: Kenalog 10 + 3 cc lidociane Pain immediately improved suggesting accurate placement of the medication. Advised to call if fevers/chills, erythema, induration, drainage, or persistent bleeding.  Use compression sleeve HEP and work with PT Gradually increase activity heellift  Reck 6 weeks

## 2013-03-23 NOTE — Patient Instructions (Signed)
Please do the following exercises:  Diver - 4 sets of 6, 4 days per week  Extender- 3 sets of 12 daily  Gilder - 3 sets of 8 - 4 days per week  Please follow up in 6 weeks  Thank you for seeing us today!

## 2013-03-23 NOTE — Progress Notes (Signed)
   Subjective:    Patient ID: Ian BushmanGracia L Cuccaro, female    DOB: [birth year], 53 y.o.   MRN: [id number]  HPI  Pt presents to clinic for f/u of thoracic back pain. Had trigger point injection last visit to break spasm, this was helpful. She is interested in getting another trigger point injection today in a different area of back. Seeing Brit PT which has been helpful.  Last Friday felt L hamstring pain with walking on treadmill. Now feeling L HS tightness. Hx of old L hamstring injury- torn at the proximal attachment. With significant retraction New deficit along medial border of avulsed muscle  Seeing PT an sent for further eval   Review of Systems     Objective:   Physical Exam  NAD There were no vitals taken for this visit.  Lt hamstring- strength good to mild testing Large mass of retracted muscle into mid hamstring            Assessment & Plan:

## 2013-05-04 ENCOUNTER — Encounter: Payer: Self-pay | Admitting: Sports Medicine

## 2013-05-05 ENCOUNTER — Ambulatory Visit (INDEPENDENT_AMBULATORY_CARE_PROVIDER_SITE_OTHER): Payer: BC Managed Care – PPO | Admitting: Sports Medicine

## 2013-05-05 ENCOUNTER — Encounter: Payer: Self-pay | Admitting: Sports Medicine

## 2013-05-05 VITALS — BP 110/67 | HR 48 | Ht 62.0 in | Wt 100.0 lb

## 2013-05-05 DIAGNOSIS — S76399A Other specified injury of muscle, fascia and tendon of the posterior muscle group at thigh level, unspecified thigh, initial encounter: Secondary | ICD-10-CM

## 2013-05-05 DIAGNOSIS — M549 Dorsalgia, unspecified: Secondary | ICD-10-CM

## 2013-05-05 DIAGNOSIS — IMO0002 Reserved for concepts with insufficient information to code with codable children: Secondary | ICD-10-CM

## 2013-05-05 NOTE — Assessment & Plan Note (Addendum)
Two L costovertebral injections performed under ultrasound guidance by Dr. Darrick PennaFields today. Will refer to kinesiologist Cameron Sprang(Lori Gordon) for evaluation and recommendations regarding exercise program for back strengthening. F/u in 6 weeks for recheck.  Procedure:  Injection of costo vertebral joints 8 and 10 Consent obtained and verified. Time-out conducted. Noted no overlying erythema, induration, or other signs of local infection. Skin prepped in a sterile fashion. Topical analgesic spray: none Completed without difficulty. Used US guidance to place from transverse probe position into joint.  Lung visualized before and after procedure with normal inflation and motion. Meds: 1 cc kenalog 10 and 3 ccs lidocaine in T8 and 1cc kenalog and 1 cc lidocaine 1% in T 10 Pain immediately improved suggesting accurate placement of the medication. Advised to call if fevers/chills, erythema, induration, drainage, or persistent bleeding or breathing issue.  She will let us know whether this continues to be effective.

## 2013-05-05 NOTE — Assessment & Plan Note (Signed)
Improved. Continue to monitor. 
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
## 2013-06-16 ENCOUNTER — Other Ambulatory Visit: Payer: BC Managed Care – PPO | Admitting: Sports Medicine

## 2013-06-29 ENCOUNTER — Ambulatory Visit (INDEPENDENT_AMBULATORY_CARE_PROVIDER_SITE_OTHER): Payer: BC Managed Care – PPO | Admitting: Sports Medicine

## 2013-06-29 ENCOUNTER — Encounter: Payer: Self-pay | Admitting: Sports Medicine

## 2013-06-29 VITALS — BP 109/62 | Ht 62.0 in | Wt 100.0 lb

## 2013-06-29 DIAGNOSIS — S76319A Strain of muscle, fascia and tendon of the posterior muscle group at thigh level, unspecified thigh, initial encounter: Secondary | ICD-10-CM

## 2013-06-29 DIAGNOSIS — S76399A Other specified injury of muscle, fascia and tendon of the posterior muscle group at thigh level, unspecified thigh, initial encounter: Secondary | ICD-10-CM

## 2013-06-29 DIAGNOSIS — IMO0002 Reserved for concepts with insufficient information to code with codable children: Secondary | ICD-10-CM

## 2013-06-29 NOTE — Progress Notes (Signed)
Patient ID: Tiffany BushmanGracia L Thor, female   DOB: 1961/02/08, 53 y.o.   MRN: [id number]  2 wks ago had bruising RT med thigh Now feels this and sometimes tight Hx of tear of left HS Plays tennis She did not remember a specific injury. However the tightness does return when she plays tennis now or does a lot of walking  4 to 5 really good weeks p CSI of costo vertebral joint under US  back symptoms just below the left shoulder blade are starting to return  Physical exam No acute distress thin athletic female BP 109/62  Ht 5\' 2"  (1.575 m)  Wt 100 lb (45.36 kg)  BMI 18.29 kg/m2  Examination of the left hamstring reveals a large visible deformity with retraction of the left biceps femoris The left hamstring has significant weakness when tested in external rotation of the foot The medial hamstrings are strong  Testing of the right thigh reveals full range of motion the hip She has good abduction, adduction and flexion strength She has normal hamstring strength Testing hamstrings and adduction she gets pain in the upper medial thigh

## 2013-06-29 NOTE — Assessment & Plan Note (Signed)
Overall with her good strip of think we will start her of a specific program exercises She is given a hamstring protocol Adductor and thigh exercises Use of a compression sleeve  Try this for 6 weeks but if not responding we'll do additional valuation

## 2013-06-29 NOTE — Patient Instructions (Signed)
Diver exercise not to point of back sxs - 4 sets of 8 Reverse lunge with 2 lb ankle weights - 3 sets of 15 These 2 exercises are 4 x week  Extender do 5 sets of 8 to count of 3  Do daily  Rotated leg lifts - all 4 positions - do 3 sets of 10 with your ankle weight Do these 3 x per week  On left leg add some reverse leg lifts with the foot turned outward  You have a strain of semitendinosus and gracilis on RT - partly from compensation from non functional biceps femoris on left  Hamstring compression sleeve may feel good for tennis  For back add a few 1 set of 10 upright rows and external rotation flys  Run return start at 15 mins 3 x week but only run 2 mins and walk 1 After 2 weeks increase by 5 mins and stay on that pattern  For your back we can plan on about 6 weeks reinject if needed

## 2013-07-13 ENCOUNTER — Other Ambulatory Visit: Payer: BC Managed Care – PPO | Admitting: Sports Medicine

## 2013-08-10 ENCOUNTER — Other Ambulatory Visit: Payer: BC Managed Care – PPO | Admitting: Sports Medicine

## 2013-09-14 ENCOUNTER — Other Ambulatory Visit: Payer: BC Managed Care – PPO | Admitting: Sports Medicine

## 2013-10-20 ENCOUNTER — Other Ambulatory Visit: Payer: BC Managed Care – PPO | Admitting: Sports Medicine

## 2014-02-01 ENCOUNTER — Other Ambulatory Visit: Payer: Self-pay | Admitting: Obstetrics & Gynecology

## 2014-02-02 LAB — CYTOLOGY - PAP

## 2014-02-03 ENCOUNTER — Other Ambulatory Visit: Payer: Self-pay | Admitting: *Deleted

## 2014-02-03 MED ORDER — DIAZEPAM 5 MG PO TABS: 5.0000 mg | ORAL_TABLET | Freq: Two times a day (BID) | ORAL | Status: DC | PRN

## 2014-02-10 ENCOUNTER — Encounter: Payer: Self-pay | Admitting: Sports Medicine

## 2014-02-10 ENCOUNTER — Ambulatory Visit (INDEPENDENT_AMBULATORY_CARE_PROVIDER_SITE_OTHER): Payer: BC Managed Care – PPO | Admitting: Sports Medicine

## 2014-02-10 VITALS — BP 103/48 | HR 53 | Ht 62.0 in | Wt 100.0 lb

## 2014-02-10 DIAGNOSIS — S76392S Other specified injury of muscle, fascia and tendon of the posterior muscle group at thigh level, left thigh, sequela: Secondary | ICD-10-CM | POA: Diagnosis not present

## 2014-02-10 DIAGNOSIS — M9905 Segmental and somatic dysfunction of pelvic region: Secondary | ICD-10-CM | POA: Diagnosis not present

## 2014-02-10 DIAGNOSIS — M546 Pain in thoracic spine: Secondary | ICD-10-CM

## 2014-02-10 DIAGNOSIS — M9902 Segmental and somatic dysfunction of thoracic region: Secondary | ICD-10-CM

## 2014-02-10 DIAGNOSIS — M9903 Segmental and somatic dysfunction of lumbar region: Secondary | ICD-10-CM | POA: Diagnosis not present

## 2014-02-10 MED ORDER — NITROGLYCERIN 0.2 MG/HR TD PT24: MEDICATED_PATCH | TRANSDERMAL | Status: DC

## 2014-02-10 NOTE — Progress Notes (Signed)
Tiffany Park - 53 y.o. female MRN [id number]  Date of birth: 05/14/1960  CC & HPI:  Tiffany Park follows up today for: MID THORACIC AND LOW BACK PAIN: reports overall being improved from her last visit however she reports intermittent flareups including earlier this week she experienced significant left-sided midthoracic discomfort as well as left-sided SI joint region discomfort. She has been working with 2 physical therapist with dry needling, therapeutic exercises as well as manual medicine. She has been performing these exercises on a regular basis and reports good improvement. However she has noticed that if she does not see her therapist on a somewhat frequent basis she has significant recurrence that oftentimes is debilitating and rated as a 10/10 pain. Today she reports her mid thoracic pain as 4/10. She's previously tried Journalist, newspaperosteopathic manipulation and reports she did well with this and would like this repeated today.  ROS:  Per HPI.   HISTORY: Past Medical, Surgical, Social, and Family History Reviewed & Updated per EMR.   OBJECTIVE:  VS:   HT:5\' 2"  (157.5 cm)   WT:100 lb (45.36 kg)  BMI:18.3          BP:(!) 103/48 mmHg  HR:(!) 53bpm  TEMP: ( )  RESP:   PHYSICAL EXAM: GENERAL:  adult Caucasian female. In no discomfort; no respiratory distress   PSYCH: alert and appropriate, good insight   NEURO: sensation is intact to light touch in bilateral lower extremities   VASCULAR:  No significant edema.   BACK EXAM: TTP over the mid thoracic region from T4-T10.  ropey tissue texture changes. OSTEOPATHIC EXAM:  T4-T10 neutral rotated left T2 rotated right L4 extended rotated right Left ASIS compression test positive inferior innominate Positive Thomas test on the left Straight leg raise to the left markedly limited compared to the right. She does have a palpable defect from her prior hamstring injury restricted right thoracic rotation by approximately 20 and testing and hands and  knees position lumbar rotation is symmetric  ASSESSMENT: 1. Left-sided thoracic back pain   2. Somatic dysfunction of spine, thoracic   3. Somatic dysfunction of spine, lumbar   4. Somatic dysfunction of pelvis region   5. Avulsion of hamstring muscle, left, sequela     PLAN: See problem based charting & AVS for additional documentation.  We have reviewed thoracic mobilization techniques and thoracic rotation therapeutic exercises as tested in hands and knees position. We've encouraged her to do these on a daily basis in addition to the other exercises that she has been performing.   Also discussed using a PNF type stretch on the door frame to isolate the thoracic region.   She has done well with the injection at the costovertebral joint. She does not wish this to be repeated today this could be considered in the future.  continue with physical therapy at this time working with manual therapy as well as mobilization techniques that seem to be benefiting her the most. Hopefully with the addition of these home exercises ultimately she will be able to continue providing a lot of these therapies at home but given the fact that she is having significant interference with her life on a somewhat frequent basis when this flares, maintenance physical therapy is justified at this time.  Incidentally she also requested a refill on her nitroglycerin patch for lateral epicondylitis as she has done well this in the past. Prescription sent today. > we did discuss we can see her in OMT clinic if she feels as  this benefited her. > Return if symptoms worsen or fail to improve.

## 2014-03-09 ENCOUNTER — Ambulatory Visit: Payer: BC Managed Care – PPO | Admitting: Sports Medicine

## 2014-05-11 ENCOUNTER — Encounter: Payer: Self-pay | Admitting: Sports Medicine

## 2014-05-11 ENCOUNTER — Ambulatory Visit (INDEPENDENT_AMBULATORY_CARE_PROVIDER_SITE_OTHER): Payer: BLUE CROSS/BLUE SHIELD | Admitting: Sports Medicine

## 2014-05-11 VITALS — BP 96/59 | Ht 62.0 in | Wt 100.0 lb

## 2014-05-11 DIAGNOSIS — M9903 Segmental and somatic dysfunction of lumbar region: Secondary | ICD-10-CM | POA: Diagnosis not present

## 2014-05-11 DIAGNOSIS — M9902 Segmental and somatic dysfunction of thoracic region: Secondary | ICD-10-CM

## 2014-05-11 DIAGNOSIS — M546 Pain in thoracic spine: Secondary | ICD-10-CM

## 2014-05-11 DIAGNOSIS — M545 Low back pain, unspecified: Secondary | ICD-10-CM

## 2014-05-11 DIAGNOSIS — M9905 Segmental and somatic dysfunction of pelvic region: Secondary | ICD-10-CM | POA: Diagnosis not present

## 2014-05-12 MED ORDER — MELOXICAM 15 MG PO TABS: 15.0000 mg | ORAL_TABLET | Freq: Every day | ORAL | Status: DC | PRN

## 2014-05-19 NOTE — Progress Notes (Signed)
  Tiffany Park - 54 y.o. female MRN [id number]  Date of birth: 11/18/1960 [id number] chief complaint left low back pain SUBJECTIVE: CC: 1. Left Low Back Pain, established pt, new problem     HPI:  [name] is here for osteopathic manipulation.  She has been experiencing left low back/SI joint pain for the past 3 weeks.   She has been working with her physical therapist and not really getting much relief.  She has no radicular symptoms or weakness.   Has tried over-the-counter anti-inflammatories without significant relief.   Denies any trauma or significant nighttime awakenings.      ROS:  per HPI   HISTORY:  Past Medical, Surgical, Social, and Family History reviewed & updated per EMR.  Pertinent Historical Findings include:  reports that she has never smoked. She has never used smokeless tobacco.    OBJECTIVE:  VS:   HT:5\' 2"  (157.5 cm)   WT:100 lb (45.36 kg)  BMI:18.3          BP:(!) 96/59 mmHg  HR: bpm  TEMP: ( )  RESP:   PHYSICAL EXAM: Adult Caucasian female no acute distress she is alert and appropriately interactive.  OSTEOPATHIC EXAM: Pseudo-short right leg. Positive ASIS compression test on right. Right anterior innominate. L5 rotated left side bent right. L3 rotated right, side bent left.   ASSESSMENT: 1. Left-sided thoracic back pain   2. Somatic dysfunction of spine, thoracic   3. Somatic dysfunction of spine, lumbar   4. Somatic dysfunction of pelvis region   5. Right-sided low back pain without sciatica   No problems updated.   PROCEDURE NOTE : Osteopathic Manipulation. The decision today to treat with OMT was based on physical exam. Verbal consent was obtained after after explanation of risks, benefits and potential side effects, including acute pain flare, post manipulation soreness and need for repeat treatments.             Regions treated:  lumbar, pelvic, sacrum, lower extremity          Techniques used:  HVLA, myofascial release, muscle  energy, A patient tolerated procedure well with reported improvement in symptoms. Patient given medications, exercises, stretches and lifestyle modifications per AVS and verbally.     PLAN: See problem based charting & AVS for additional documentation.   OMT as above.   Home exercise program reviewed.   Meloxicam refill.  > No Follow-up on file.

## 2014-05-25 ENCOUNTER — Ambulatory Visit: Payer: BLUE CROSS/BLUE SHIELD | Admitting: Sports Medicine

## 2014-06-01 ENCOUNTER — Ambulatory Visit: Payer: BLUE CROSS/BLUE SHIELD | Admitting: Sports Medicine

## 2014-06-08 ENCOUNTER — Encounter: Payer: Self-pay | Admitting: Sports Medicine

## 2014-06-08 ENCOUNTER — Ambulatory Visit (INDEPENDENT_AMBULATORY_CARE_PROVIDER_SITE_OTHER): Payer: BLUE CROSS/BLUE SHIELD | Admitting: Sports Medicine

## 2014-06-08 VITALS — BP 100/56 | HR 58 | Ht 62.0 in | Wt 100.0 lb

## 2014-06-08 DIAGNOSIS — M9903 Segmental and somatic dysfunction of lumbar region: Secondary | ICD-10-CM

## 2014-06-08 DIAGNOSIS — M546 Pain in thoracic spine: Secondary | ICD-10-CM | POA: Diagnosis not present

## 2014-06-08 DIAGNOSIS — M9905 Segmental and somatic dysfunction of pelvic region: Secondary | ICD-10-CM | POA: Diagnosis not present

## 2014-06-08 DIAGNOSIS — M9902 Segmental and somatic dysfunction of thoracic region: Secondary | ICD-10-CM

## 2014-06-08 DIAGNOSIS — M999 Biomechanical lesion, unspecified: Secondary | ICD-10-CM

## 2014-06-08 NOTE — Assessment & Plan Note (Signed)
PROCEDURE NOTE : Osteopathic Manipulation. The decision today to treat with OMT was based on physical exam. Verbal consent was obtained after after explanation of risks, benefits and potential side effects, including acute pain flare, post manipulation soreness and need for repeat treatments.             Regions treated:  Thoracic, Ribs, Lumbar, Pelvic, and Sacrum          Techniques used:  HVLA, MFR, ME, Articulatory A patient tolerated procedure well with reported improvement in symptoms. Patient given medications, exercises, stretches and lifestyle modifications per AVS and verbally.    HEP: Thoracic and lumbar rotation exercises with child's pose and on hands and knees
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
## 2014-06-22 ENCOUNTER — Ambulatory Visit: Payer: BLUE CROSS/BLUE SHIELD | Admitting: Sports Medicine

## 2014-07-19 ENCOUNTER — Ambulatory Visit: Payer: BLUE CROSS/BLUE SHIELD | Admitting: Sports Medicine

## 2014-08-16 ENCOUNTER — Ambulatory Visit: Payer: BLUE CROSS/BLUE SHIELD | Admitting: Sports Medicine

## 2014-09-16 ENCOUNTER — Other Ambulatory Visit: Payer: Self-pay | Admitting: *Deleted

## 2014-09-16 MED ORDER — DICLOFENAC SODIUM 1 % TD GEL: 2.0000 g | Freq: Two times a day (BID) | TRANSDERMAL | Status: DC | PRN

## 2014-11-10 ENCOUNTER — Ambulatory Visit: Payer: BLUE CROSS/BLUE SHIELD | Admitting: Sports Medicine

## 2015-02-17 ENCOUNTER — Other Ambulatory Visit: Payer: Self-pay | Admitting: Sports Medicine

## 2015-02-17 NOTE — Telephone Encounter (Signed)
Are these 2 meds ok to refill

## 2015-02-28 ENCOUNTER — Other Ambulatory Visit: Payer: Self-pay | Admitting: *Deleted

## 2015-02-28 MED ORDER — NITROGLYCERIN 0.2 MG/HR TD PT24: MEDICATED_PATCH | TRANSDERMAL | Status: DC

## 2015-03-22 DIAGNOSIS — M62838 Other muscle spasm: Secondary | ICD-10-CM | POA: Diagnosis not present

## 2015-03-22 DIAGNOSIS — M792 Neuralgia and neuritis, unspecified: Secondary | ICD-10-CM | POA: Diagnosis not present

## 2015-03-22 DIAGNOSIS — M7712 Lateral epicondylitis, left elbow: Secondary | ICD-10-CM | POA: Diagnosis not present

## 2015-03-28 MED FILL — ESTRADIOL 0.1 MG PATCH: 0.1 | 28 days supply | Qty: 8 | Fill #1

## 2015-05-01 MED FILL — MINIVELLE 0.1 MG PATCH: 0.1 | 28 days supply | Qty: 8 | Fill #2

## 2015-05-02 MED FILL — SYNTHROID 112 MCG TABLET: 112 | 90 days supply | Qty: 90 | Fill #0

## 2015-06-01 MED FILL — MINIVELLE 0.1 MG PATCH: 0.1 | 28 days supply | Qty: 8 | Fill #3

## 2015-06-28 MED FILL — MINIVELLE 0.1 MG PATCH: 0.1 | 28 days supply | Qty: 8 | Fill #4

## 2015-07-12 MED FILL — PROGESTERONE 100 MG CAPSULE: 100 | 84 days supply | Qty: 12 | Fill #1

## 2015-07-31 MED FILL — SYNTHROID 112 MCG TABLET: 112 | 90 days supply | Qty: 90 | Fill #1

## 2015-07-31 MED FILL — MINIVELLE 0.1 MG PATCH: 0.1 | 28 days supply | Qty: 8 | Fill #5

## 2015-09-01 MED FILL — MINIVELLE 0.1 MG PATCH: 0.1 | 28 days supply | Qty: 8 | Fill #6

## 2015-09-22 ENCOUNTER — Other Ambulatory Visit: Payer: Self-pay | Admitting: *Deleted

## 2015-09-22 MED ORDER — NITROGLYCERIN 0.2 MG/HR TD PT24: MEDICATED_PATCH | TRANSDERMAL | 1 refills | Status: DC

## 2015-09-22 MED FILL — NITROGLYCERIN 0.2 MG/HR PTC: 0.2 | 30 days supply | Qty: 8 | Fill #0

## 2015-09-26 MED FILL — MINIVELLE 0.1 MG PATCH: 0.1 | 28 days supply | Qty: 8 | Fill #7

## 2015-10-10 ENCOUNTER — Ambulatory Visit (INDEPENDENT_AMBULATORY_CARE_PROVIDER_SITE_OTHER): Payer: BLUE CROSS/BLUE SHIELD | Admitting: Sports Medicine

## 2015-10-10 ENCOUNTER — Encounter: Payer: Self-pay | Admitting: Sports Medicine

## 2015-10-10 VITALS — BP 109/73 | HR 64 | Ht 62.0 in | Wt 100.0 lb

## 2015-10-10 DIAGNOSIS — M25562 Pain in left knee: Secondary | ICD-10-CM | POA: Diagnosis not present

## 2015-10-10 DIAGNOSIS — M25512 Pain in left shoulder: Secondary | ICD-10-CM | POA: Insufficient documentation

## 2015-10-10 NOTE — Assessment & Plan Note (Signed)
Patient is presenting with left-sided shoulder pain. Pain was relatively vague in location. Pain was reproducible with specific motion of the shoulder. Etiology unknown at this time however there is concern for C-spine radiculopathy as the pain patient is complaining of seems to be referred more distal along the lateral aspect of the humerus than typical rotator cuff pain. Exam yielded some positive signs for impingement and bicipital tendinitis. - Will obtain x-rays of the C-spine - We'll contact patient with the results of these images. - Encouraged to allow pain to be her guide.

## 2015-10-10 NOTE — Assessment & Plan Note (Addendum)
Patient has been experiencing some left knee pain while doing exercises with her physical therapist. Pain reported is consistent with nerve irritation. - Discussed the likelihood of nerve irritation being caused by some of the exercises in physical therapy. - Patient advised to avoid these exercises if this pain/discomfort becomes intolerable. - Ice as needed - May consider knee padding as needed for any activities requiring patient to place pressure on her knees.

## 2015-10-10 NOTE — Progress Notes (Signed)
HPI  CC: Left arm pain; left knee pain (independent issues) Patient is here with complaints of left arm pain. She states that her left arm has been achy with occasional sharp pains over the past 3 weeks. 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 Following day she first noticed this discomfort. Pain is located to the anterior lateral aspect of the humeral midshaft. Pain does not radiate. Pain is worse with overhead movements like while dressing. She denies any weakness, numbness, or paresthesias. No history of specific injury to this area. Denies feelings of instability.  Patient also complains of left knee pain. She states that this pain is located directly over the patella. She experiences this pain while performing a "crawling exercise" which she performs during physical therapy. Pain is described as sharp and occasionally burning. She does not experience this pain while walking, standing, or laying down. She denies any injury. She denies any swelling, or feelings of instability.  See HPI for ROS. CC and VS noted.  Objective: BP 109/73   Pulse 64   Ht 5\' 2"  (1.575 m)   Wt 100 lb (45.4 kg)   BMI 18.29 kg/m  Gen: NAD, alert, cooperative. CV: Well-perfused. Resp: Non-labored. Neuro: Sensation intact throughout. Shoulder, Left: Inspection reveals no abnormalities, atrophy or asymmetry. Palpation showed some tenderness the bicipital groove. ROM is full in all planes. Rotator cuff strength normal throughout. Some evidence of impingement with Hawkin's tests. Negative empty can. Speeds negative. Yergason's minimally positive. Obrien's positive. Negative clunk and good stability. Normal scapular function observed. No painful arc and no drop arm sign.  Knee, Left: No effusion or obvious bony abnormalities. Palpation with no warmth or joint line, or condyle tenderness. Reproducible pain noted at the  inferior pole of the patella. ROM normal in flexion and extension and lower leg rotation. Ligaments with solid consistent endpoints including ACL, PCL, LCL, MCL. Negative Thessaly's. Non painful patellar compression. Patellar and quadriceps tendons unremarkable. Hamstring and quadriceps strength is normal.  Ultrasound Left shoulder: Normal supraspinatus, infraspinatus, subscapularis and TM Bicipital tendon normal - sits at upper edge of groove AC normal Upper arm - scan of deltoid to biceps and brachioradialis is normal  Assessment and plan:  Shoulder pain, left Patient is presenting with left-sided shoulder pain. Pain was relatively vague in location. Pain was reproducible with specific motion of the shoulder. Etiology unknown at this time however there is concern for C-spine radiculopathy as the pain patient is complaining of seems to be referred more distal along the lateral aspect of the humerus than typical rotator cuff pain. Exam yielded some positive signs for impingement and bicipital tendinitis. - Will obtain x-rays of the C-spine - We'll contact patient with the results of these images. - Encouraged to allow pain to be her guide.  Left knee pain Patient has been experiencing some left knee pain while doing exercises with her physical therapist. Pain reported is consistent with nerve irritation. - Discussed the likelihood of nerve irritation being caused by some of the exercises in physical therapy. - Patient advised to avoid these exercises if this pain/discomfort becomes intolerable. - Ice as needed - May consider knee padding as needed for any activities requiring patient to place pressure on her knees.   Orders Placed This Encounter  Procedures  . DG Cervical Spine Complete    Standing Status:   Future    Standing Expiration Date:  12/09/2016    Order Specific Question:   Reason for Exam (SYMPTOM  OR DIAGNOSIS REQUIRED)    Answer:   referred left shoulder pain    Order  Specific Question:   Is the patient pregnant?    Answer:   No    Order Specific Question:   Preferred imaging location?    Answer:   [facility]     Kathee DeltonIan D McKeag, MD,MS,  PGY3 10/10/2015 5:59 PM    Agree with assessment.   We will need to re-evaluate if CSpine does not show expected pathology.  Sterling BigKB Fields, MD

## 2015-10-11 ENCOUNTER — Ambulatory Visit
Admission: RE | Admit: 2015-10-11 | Discharge: 2015-10-11 | Disposition: A | Discharge status: Home / Self Care | Payer: BLUE CROSS/BLUE SHIELD | Source: Ambulatory Visit | Attending: Sports Medicine | Admitting: Sports Medicine

## 2015-10-11 DIAGNOSIS — M25512 Pain in left shoulder: Secondary | ICD-10-CM

## 2015-10-31 MED FILL — MINIVELLE 0.1 MG PATCH: 0.1 | 28 days supply | Qty: 8 | Fill #8

## 2015-10-31 MED FILL — SYNTHROID 112 MCG TABLET: 112 | 90 days supply | Qty: 90 | Fill #2

## 2015-11-24 MED FILL — MINIVELLE 0.1 MG PATCH: 0.1 | 28 days supply | Qty: 8 | Fill #9 | Status: TO

## 2015-11-29 MED FILL — CEPHALEXIN 500 MG CAPSULE: 500 | 7 days supply | Qty: 21 | Fill #0

## 2015-12-04 MED FILL — PROGESTERONE 100 MG CAPSULE: 100 | 84 days supply | Qty: 12 | Fill #2

## 2015-12-29 MED FILL — MINIVELLE 0.1 MG PATCH: 0.1 | 28 days supply | Qty: 8 | Fill #0 | Status: TO

## 2016-01-17 MED FILL — SYNTHROID 112 MCG TABLET: 112 | 90 days supply | Qty: 90 | Fill #0

## 2016-01-22 MED FILL — MINIVELLE 0.1 MG PATCH: 0.1 | 28 days supply | Qty: 8 | Fill #0

## 2016-02-01 MED FILL — CLOBETASOL 0.05% CREAM: 0.05 | 10 days supply | Qty: 30 | Fill #1

## 2016-02-22 MED FILL — MINIVELLE 0.1 MG PATCH: 0.1 | 28 days supply | Qty: 8 | Fill #0

## 2016-03-05 DIAGNOSIS — Z01419 Encounter for gynecological examination (general) (routine) without abnormal findings: Secondary | ICD-10-CM | POA: Diagnosis not present

## 2016-03-05 DIAGNOSIS — Z1231 Encounter for screening mammogram for malignant neoplasm of breast: Secondary | ICD-10-CM | POA: Diagnosis not present

## 2016-03-05 DIAGNOSIS — N760 Acute vaginitis: Secondary | ICD-10-CM | POA: Diagnosis not present

## 2016-03-05 DIAGNOSIS — Z681 Body mass index (BMI) 19 or less, adult: Secondary | ICD-10-CM | POA: Diagnosis not present

## 2016-03-07 MED FILL — PROGESTERONE 100 MG CAPSULE: 100 | 28 days supply | Qty: 12 | Fill #0

## 2016-03-07 MED FILL — FLUCONAZOLE 150 MG TABLET: 150 | 6 days supply | Qty: 3 | Fill #0

## 2016-03-12 DIAGNOSIS — M7712 Lateral epicondylitis, left elbow: Secondary | ICD-10-CM | POA: Diagnosis not present

## 2016-03-12 DIAGNOSIS — M792 Neuralgia and neuritis, unspecified: Secondary | ICD-10-CM | POA: Diagnosis not present

## 2016-03-12 DIAGNOSIS — M62838 Other muscle spasm: Secondary | ICD-10-CM | POA: Diagnosis not present

## 2016-03-19 DIAGNOSIS — M7712 Lateral epicondylitis, left elbow: Secondary | ICD-10-CM | POA: Diagnosis not present

## 2016-03-19 DIAGNOSIS — M792 Neuralgia and neuritis, unspecified: Secondary | ICD-10-CM | POA: Diagnosis not present

## 2016-03-19 DIAGNOSIS — M62838 Other muscle spasm: Secondary | ICD-10-CM | POA: Diagnosis not present

## 2016-04-03 DIAGNOSIS — M792 Neuralgia and neuritis, unspecified: Secondary | ICD-10-CM | POA: Diagnosis not present

## 2016-04-03 DIAGNOSIS — M7712 Lateral epicondylitis, left elbow: Secondary | ICD-10-CM | POA: Diagnosis not present

## 2016-04-03 DIAGNOSIS — M62838 Other muscle spasm: Secondary | ICD-10-CM | POA: Diagnosis not present

## 2016-04-04 MED FILL — ESTRADIOL 0.1 MG PATCH: 0.1 | 28 days supply | Qty: 8 | Fill #0

## 2016-04-10 DIAGNOSIS — J3489 Other specified disorders of nose and nasal sinuses: Secondary | ICD-10-CM | POA: Diagnosis not present

## 2016-04-10 DIAGNOSIS — R040 Epistaxis: Secondary | ICD-10-CM | POA: Diagnosis not present

## 2016-04-10 DIAGNOSIS — H6122 Impacted cerumen, left ear: Secondary | ICD-10-CM | POA: Diagnosis not present

## 2016-04-10 DIAGNOSIS — H04123 Dry eye syndrome of bilateral lacrimal glands: Secondary | ICD-10-CM | POA: Diagnosis not present

## 2016-05-03 MED FILL — ESTRADIOL 0.1 MG PATCH: 0.1 | 28 days supply | Qty: 8 | Fill #1

## 2016-05-03 MED FILL — SYNTHROID 112 MCG TABLET: 112 | 90 days supply | Qty: 90 | Fill #1

## 2016-05-27 MED FILL — XIIDRA 5% EYE DROPS: 5 | 30 days supply | Qty: 60 | Fill #0

## 2016-05-27 MED FILL — ESTRADIOL 0.1 MG PATCH: 0.1 | 28 days supply | Qty: 8 | Fill #2

## 2016-05-29 MED FILL — DOXYCYCLINE MONO 100 MG TAB: 100 | 7 days supply | Qty: 8 | Fill #0

## 2016-06-18 MED FILL — ESTRADIOL 0.1 MG PATCH: 0.1 | 28 days supply | Qty: 8 | Fill #3

## 2016-06-20 DIAGNOSIS — E039 Hypothyroidism, unspecified: Secondary | ICD-10-CM | POA: Diagnosis not present

## 2016-07-15 DIAGNOSIS — N950 Postmenopausal bleeding: Secondary | ICD-10-CM | POA: Diagnosis not present

## 2016-07-15 DIAGNOSIS — R5383 Other fatigue: Secondary | ICD-10-CM | POA: Diagnosis not present

## 2016-07-16 MED FILL — PROGESTERONE 200 MG CAPSULE: 200 | 30 days supply | Qty: 12 | Fill #0

## 2016-07-24 MED FILL — SYNTHROID 112 MCG TABLET: 112 | 90 days supply | Qty: 90 | Fill #2

## 2016-07-25 MED FILL — VIVELLE-DOT 0.1 MG PATCH: 0.1 | 28 days supply | Qty: 8 | Fill #0

## 2016-08-21 DIAGNOSIS — N951 Menopausal and female climacteric states: Secondary | ICD-10-CM | POA: Diagnosis not present

## 2016-08-21 DIAGNOSIS — R8299 Other abnormal findings in urine: Secondary | ICD-10-CM | POA: Diagnosis not present

## 2016-08-21 DIAGNOSIS — R938 Abnormal findings on diagnostic imaging of other specified body structures: Secondary | ICD-10-CM | POA: Diagnosis not present

## 2016-08-22 DIAGNOSIS — R938 Abnormal findings on diagnostic imaging of other specified body structures: Secondary | ICD-10-CM | POA: Diagnosis not present

## 2016-08-22 DIAGNOSIS — N840 Polyp of corpus uteri: Secondary | ICD-10-CM | POA: Diagnosis not present

## 2016-08-22 MED FILL — VIVELLE-DOT 0.1 MG PATCH: 0.1 | 28 days supply | Qty: 8 | Fill #1

## 2016-08-26 MED FILL — XIIDRA 5% EYE DROPS: 5 | 30 days supply | Qty: 60 | Fill #1

## 2016-08-30 MED FILL — OLOPATADINE HCL 0.2 % SOLN: 0.2 | 25 days supply | Qty: 3 | Fill #0

## 2016-09-03 ENCOUNTER — Encounter (HOSPITAL_BASED_OUTPATIENT_CLINIC_OR_DEPARTMENT_OTHER): Payer: Self-pay | Admitting: *Deleted

## 2016-09-10 NOTE — H&P (Signed)
NAME:  Sudie BaileyLLEY, Shawneequa                    ACCOUNT NO.:  MEDICAL RECORD NO.:  [id number]  LOCATION:                                 FACILITY:  PHYSICIAN:  Freddy FinnerW. Ronald Decarla Siemen, M.D.        DATE OF BIRTH:  DATE OF ADMISSION: DATE OF DISCHARGE:                             HISTORY & PHYSICAL   Admission date will be September 16, 2016 as an outpatient for outpatient surgery.  ADMITTING DIAGNOSIS:  Postmenopausal bleeding, suspected endometrial polyps.  HISTORY OF PRESENT ILLNESS:  The patient is a 56 year old, gravida 3, para 2, who has been on hormone replacement therapy and had episode of postmenopausal bleeding.  An ultrasound in the office which identified what appeared to be 2 endometrial polyps.  The patient is otherwise asymptomatic and is now admitted for hysteroscopy, D and C.  REVIEW OF SYSTEMS:  Basically negative.  No cardiopulmonary, GI, or GU complaints.  PAST MEDICAL HISTORY:  The patient is known to have hypothyroidism for which she takes Synthroid 112 mcg.  She has no other medical illnesses. Her only other medication is Vivelle transdermal skin patch for hormone replacement therapy and oral progesterone 200 mg, Prometrium at bedtime.  PREVIOUS OPERATIVE PROCEDURES:  Include tonsillectomy, D and C, and delivery of 2 children.  She also had an ovary removed in 2011.  DRUG ALLERGY:  CODEINE.  FAMILY HISTORY:  Noncontributory.  PHYSICAL EXAMINATION:  HEENT:  Grossly normal.  Thyroid is not palpably enlarged on my examination. VITAL SIGNS:  Her blood pressure in the office is 110/80. CHEST:  Clear to auscultation. HEART:  Normal sinus rhythm without murmurs, rubs, or gallops. ABDOMEN:  Soft, nontender, without appreciable organomegaly or palpable masses. PELVIC:  Findings will be described in the operative note. EXTREMITIES:  Without cyanosis, clubbing, or edema.  ASSESSMENT:  Menopausal female, on hormone replacement therapy with postmenopausal bleeding and endometrial  thickening.  PLAN:  Hysteroscopy, D and Deniece Ree.     Tajuan Dufault. Ronald Charday Capetillo, M.D.     WRN/MEDQ  D:  09/09/2016  T:  09/09/2016  Job:  [id number]

## 2016-09-12 ENCOUNTER — Encounter (HOSPITAL_BASED_OUTPATIENT_CLINIC_OR_DEPARTMENT_OTHER): Payer: Self-pay | Admitting: *Deleted

## 2016-09-12 DIAGNOSIS — H04122 Dry eye syndrome of left lacrimal gland: Secondary | ICD-10-CM | POA: Diagnosis not present

## 2016-09-12 DIAGNOSIS — Z79890 Hormone replacement therapy (postmenopausal): Secondary | ICD-10-CM | POA: Diagnosis not present

## 2016-09-12 DIAGNOSIS — N840 Polyp of corpus uteri: Secondary | ICD-10-CM | POA: Diagnosis not present

## 2016-09-12 DIAGNOSIS — M1990 Unspecified osteoarthritis, unspecified site: Secondary | ICD-10-CM | POA: Diagnosis not present

## 2016-09-12 DIAGNOSIS — N950 Postmenopausal bleeding: Secondary | ICD-10-CM | POA: Diagnosis not present

## 2016-09-12 DIAGNOSIS — Z885 Allergy status to narcotic agent status: Secondary | ICD-10-CM | POA: Diagnosis not present

## 2016-09-12 DIAGNOSIS — H04121 Dry eye syndrome of right lacrimal gland: Secondary | ICD-10-CM | POA: Diagnosis not present

## 2016-09-12 DIAGNOSIS — N800 Endometriosis of uterus: Secondary | ICD-10-CM | POA: Diagnosis not present

## 2016-09-12 DIAGNOSIS — I7300 Raynaud's syndrome without gangrene: Secondary | ICD-10-CM | POA: Diagnosis not present

## 2016-09-12 DIAGNOSIS — Z79899 Other long term (current) drug therapy: Secondary | ICD-10-CM | POA: Diagnosis not present

## 2016-09-12 LAB — CBC
HCT: 42.5 % (ref 36.0–46.0)
Hemoglobin: 14 g/dL (ref 12.0–15.0)
MCH: 29.3 pg (ref 26.0–34.0)
MCHC: 32.9 g/dL (ref 30.0–36.0)
MCV: 88.9 fL (ref 78.0–100.0)
PLATELETS: 243 10*3/uL (ref 150–400)
RBC: 4.78 MIL/uL (ref 3.87–5.11)
RDW: 13.1 % (ref 11.5–15.5)
WBC: 5.1 10*3/uL (ref 4.0–10.5)

## 2016-09-12 MED FILL — FLUOROMETHOLONE 0.1% DROPS: 0.1 | 25 days supply | Qty: 5 | Fill #0

## 2016-09-12 NOTE — Progress Notes (Signed)
[facility]Npo after Mn-will take synthroid with water .[facility].

## 2016-09-13 NOTE — Anesthesia Preprocedure Evaluation (Addendum)
Anesthesia Evaluation  Patient identified by MRN, date of birth, ID band Patient awake    Reviewed: Allergy & Precautions, H&P , Patient's Chart, lab work & pertinent test results, reviewed documented beta blocker date and time   Airway Mallampati: II  TM Distance: >3 FB Neck ROM: full    Dental no notable dental hx.    Pulmonary    Pulmonary exam normal breath sounds clear to auscultation       Cardiovascular  Rhythm:regular Rate:Normal     Neuro/Psych    GI/Hepatic negative GI ROS, Neg liver ROS,   Endo/Other  negative endocrine ROS  Renal/GU negative Renal ROS     Musculoskeletal  (+) Arthritis , Osteoarthritis,    Abdominal   Peds  Hematology negative hematology ROS (+)   Anesthesia Other Findings Scleroderma (HCC)   Raynaud disease  + motion sickness       Reproductive/Obstetrics negative OB ROS                            Anesthesia Physical Anesthesia Plan  ASA: II  Anesthesia Plan: General   Post-op Pain Management:    Induction: Intravenous  PONV Risk Score and Plan: 3 and Ondansetron, Dexamethasone, Propofol and Scopolamine patch - Pre-op  Airway Management Planned: LMA  Additional Equipment:   Intra-op Plan:   Post-operative Plan:   Informed Consent: I have reviewed the patients History and Physical, chart, labs and discussed the procedure including the risks, benefits and alternatives for the proposed anesthesia with the patient or authorized representative who has indicated his/her understanding and acceptance.   Dental Advisory Given  Plan Discussed with: CRNA, Surgeon and Anesthesiologist  Anesthesia Plan Comments: ( )      Anesthesia Quick Evaluation

## 2016-09-13 NOTE — Anesthesia Preprocedure Evaluation (Deleted)
Anesthesia Evaluation    Airway        Dental   Pulmonary           Cardiovascular      Neuro/Psych    GI/Hepatic   Endo/Other    Renal/GU      Musculoskeletal   Abdominal   Peds  Hematology   Anesthesia Other Findings Scleroderma (HCC)   Raynaud disease     Reproductive/Obstetrics                                                              Anesthesia Evaluation  Patient identified by MRN, date of birth, ID band Patient awake    Reviewed: Allergy & Precautions, H&P , NPO status , Patient's Chart, lab work & pertinent test results  Airway Mallampati: II TM Distance: >3 FB Neck ROM: Full    Dental No notable dental hx. (+) Teeth Intact   Pulmonary neg pulmonary ROS,  clear to auscultation  Pulmonary exam normal       Cardiovascular neg cardio ROS Regular Normal    Neuro/Psych Negative Neurological ROS  Negative Psych ROS   GI/Hepatic negative GI ROS, Neg liver ROS,   Endo/Other  Negative Endocrine ROS  Renal/GU negative Renal ROS  Genitourinary negative   Musculoskeletal negative musculoskeletal ROS (+)   Abdominal   Peds negative pediatric ROS (+)  Hematology negative hematology ROS (+)   Anesthesia Other Findings   Reproductive/Obstetrics negative OB ROS                          Anesthesia Physical Anesthesia Plan  ASA: I  Anesthesia Plan: General   Post-op Pain Management:    Induction: Intravenous  Airway Management Planned: Oral ETT  Additional Equipment:   Intra-op Plan:   Post-operative Plan: Extubation in OR  Informed Consent: I have reviewed the patients History and Physical, chart, labs and discussed the procedure including the risks, benefits and alternatives for the proposed anesthesia with the patient or authorized representative who has indicated his/her understanding and acceptance.   Dental advisory  given  Plan Discussed with: CRNA  Anesthesia Plan Comments:         Anesthesia Quick Evaluation  Anesthesia Physical Anesthesia Plan  ASA: II  Anesthesia Plan: General   Post-op Pain Management:    Induction: Intravenous  PONV Risk Score and Plan:   Airway Management Planned: LMA  Additional Equipment:   Intra-op Plan:   Post-operative Plan:   Informed Consent:   Plan Discussed with: CRNA and Surgeon  Anesthesia Plan Comments: ( )        Anesthesia Quick Evaluation

## 2016-09-16 ENCOUNTER — Ambulatory Visit (HOSPITAL_BASED_OUTPATIENT_CLINIC_OR_DEPARTMENT_OTHER): Payer: 59 | Admitting: Anesthesiology

## 2016-09-16 ENCOUNTER — Ambulatory Visit (HOSPITAL_BASED_OUTPATIENT_CLINIC_OR_DEPARTMENT_OTHER)
Admission: RE | Admit: 2016-09-16 | Discharge: 2016-09-16 | Disposition: A | Discharge status: Home / Self Care | Payer: 59 | Source: Ambulatory Visit | Attending: Obstetrics & Gynecology | Admitting: Obstetrics & Gynecology

## 2016-09-16 ENCOUNTER — Encounter (HOSPITAL_BASED_OUTPATIENT_CLINIC_OR_DEPARTMENT_OTHER): Payer: Self-pay

## 2016-09-16 ENCOUNTER — Encounter (HOSPITAL_BASED_OUTPATIENT_CLINIC_OR_DEPARTMENT_OTHER)
Admission: RE | Disposition: A | Discharge status: Home / Self Care | Payer: Self-pay | Source: Ambulatory Visit | Attending: Obstetrics & Gynecology

## 2016-09-16 DIAGNOSIS — N858 Other specified noninflammatory disorders of uterus: Secondary | ICD-10-CM | POA: Diagnosis not present

## 2016-09-16 DIAGNOSIS — Z79899 Other long term (current) drug therapy: Secondary | ICD-10-CM | POA: Diagnosis not present

## 2016-09-16 DIAGNOSIS — N840 Polyp of corpus uteri: Secondary | ICD-10-CM | POA: Insufficient documentation

## 2016-09-16 DIAGNOSIS — Z885 Allergy status to narcotic agent status: Secondary | ICD-10-CM | POA: Diagnosis not present

## 2016-09-16 DIAGNOSIS — Z79890 Hormone replacement therapy (postmenopausal): Secondary | ICD-10-CM | POA: Insufficient documentation

## 2016-09-16 DIAGNOSIS — M1990 Unspecified osteoarthritis, unspecified site: Secondary | ICD-10-CM | POA: Diagnosis not present

## 2016-09-16 DIAGNOSIS — N950 Postmenopausal bleeding: Secondary | ICD-10-CM | POA: Insufficient documentation

## 2016-09-16 DIAGNOSIS — N800 Endometriosis of uterus: Secondary | ICD-10-CM | POA: Insufficient documentation

## 2016-09-16 DIAGNOSIS — I7300 Raynaud's syndrome without gangrene: Secondary | ICD-10-CM | POA: Insufficient documentation

## 2016-09-16 DIAGNOSIS — N841 Polyp of cervix uteri: Secondary | ICD-10-CM | POA: Diagnosis not present

## 2016-09-16 HISTORY — PX: HYSTEROSCOPY W/D&C: SHX1775

## 2016-09-16 SURGERY — DILATATION AND CURETTAGE /HYSTEROSCOPY
Anesthesia: General

## 2016-09-16 MED ORDER — ACETAMINOPHEN 10 MG/ML IV SOLN
INTRAVENOUS | Status: AC
  Filled 2016-09-16: qty 100

## 2016-09-16 MED ORDER — MIDAZOLAM HCL 5 MG/5ML IJ SOLN
INTRAMUSCULAR | Status: DC | PRN
  Administered 2016-09-16: 1 mg via INTRAVENOUS

## 2016-09-16 MED ORDER — GLYCOPYRROLATE 0.2 MG/ML IJ SOLN
INTRAMUSCULAR | Status: DC | PRN
  Administered 2016-09-16: 0.2 mg via INTRAVENOUS

## 2016-09-16 MED ORDER — KETOROLAC TROMETHAMINE 30 MG/ML IJ SOLN
INTRAMUSCULAR | Status: DC | PRN
  Administered 2016-09-16: 30 mg via INTRAVENOUS

## 2016-09-16 MED ORDER — DEXAMETHASONE SODIUM PHOSPHATE 10 MG/ML IJ SOLN
INTRAMUSCULAR | Status: AC
  Filled 2016-09-16: qty 1

## 2016-09-16 MED ORDER — ACETAMINOPHEN 10 MG/ML IV SOLN
INTRAVENOUS | Status: DC | PRN
  Administered 2016-09-16: 1000 mg via INTRAVENOUS

## 2016-09-16 MED ORDER — LACTATED RINGERS IV SOLN
INTRAVENOUS | Status: DC
  Administered 2016-09-16: 07:00:00 via INTRAVENOUS
  Filled 2016-09-16: qty 1000

## 2016-09-16 MED ORDER — FENTANYL CITRATE (PF) 100 MCG/2ML IJ SOLN
INTRAMUSCULAR | Status: DC | PRN
  Administered 2016-09-16: 50 ug via INTRAVENOUS

## 2016-09-16 MED ORDER — FENTANYL CITRATE (PF) 100 MCG/2ML IJ SOLN
INTRAMUSCULAR | Status: AC
  Filled 2016-09-16: qty 2

## 2016-09-16 MED ORDER — MIDAZOLAM HCL 2 MG/2ML IJ SOLN
INTRAMUSCULAR | Status: AC
  Filled 2016-09-16: qty 2

## 2016-09-16 MED ORDER — ARTIFICIAL TEARS OPHTHALMIC OINT
TOPICAL_OINTMENT | OPHTHALMIC | Status: AC
  Filled 2016-09-16: qty 3.5

## 2016-09-16 MED ORDER — CEFAZOLIN SODIUM-DEXTROSE 2-4 GM/100ML-% IV SOLN
INTRAVENOUS | Status: AC
  Filled 2016-09-16: qty 100

## 2016-09-16 MED ORDER — ONDANSETRON HCL 4 MG/2ML IJ SOLN
INTRAMUSCULAR | Status: AC
  Filled 2016-09-16: qty 2

## 2016-09-16 MED ORDER — PROPOFOL 10 MG/ML IV BOLUS
INTRAVENOUS | Status: AC
  Filled 2016-09-16: qty 40

## 2016-09-16 MED ORDER — SCOPOLAMINE 1 MG/3DAYS TD PT72
MEDICATED_PATCH | TRANSDERMAL | Status: DC | PRN
  Administered 2016-09-16: 1 via TRANSDERMAL

## 2016-09-16 MED ORDER — PROPOFOL 10 MG/ML IV BOLUS
INTRAVENOUS | Status: DC | PRN
  Administered 2016-09-16: 40 mg via INTRAVENOUS
  Administered 2016-09-16: 100 mg via INTRAVENOUS

## 2016-09-16 MED ORDER — ONDANSETRON HCL 4 MG/2ML IJ SOLN
INTRAMUSCULAR | Status: DC | PRN
  Administered 2016-09-16: 4 mg via INTRAVENOUS

## 2016-09-16 MED ORDER — DEXAMETHASONE SODIUM PHOSPHATE 4 MG/ML IJ SOLN
INTRAMUSCULAR | Status: DC | PRN
  Administered 2016-09-16: 5 mg via INTRAVENOUS

## 2016-09-16 MED ORDER — CEFAZOLIN SODIUM-DEXTROSE 2-4 GM/100ML-% IV SOLN
2.0000 g | INTRAVENOUS | Status: AC
  Administered 2016-09-16: 2 g via INTRAVENOUS
  Filled 2016-09-16: qty 100

## 2016-09-16 MED ORDER — FENTANYL CITRATE (PF) 100 MCG/2ML IJ SOLN
25.0000 ug | INTRAMUSCULAR | Status: DC | PRN
  Filled 2016-09-16: qty 1

## 2016-09-16 MED ORDER — LIDOCAINE 2% (20 MG/ML) 5 ML SYRINGE
INTRAMUSCULAR | Status: DC | PRN
  Administered 2016-09-16: 40 mg via INTRAVENOUS

## 2016-09-16 MED ORDER — SCOPOLAMINE 1 MG/3DAYS TD PT72
MEDICATED_PATCH | TRANSDERMAL | Status: AC
  Filled 2016-09-16: qty 1

## 2016-09-16 MED FILL — VIVELLE-DOT 0.1 MG PATCH: 0.1 | 28 days supply | Qty: 8 | Fill #2

## 2016-09-16 SURGICAL SUPPLY — 17 items
ABLATOR ENDOMETRIAL BIPOLAR (ABLATOR) IMPLANT
BIPOLAR CUTTING LOOP 21FR (ELECTRODE)
CANISTER SUCT 3000ML PPV (MISCELLANEOUS) ×3 IMPLANT
CATH ROBINSON RED A/P 16FR (CATHETERS) IMPLANT
DILATOR CANAL MILEX (MISCELLANEOUS) IMPLANT
GLOVE BIO SURGEON STRL SZ7.5 (GLOVE) ×3 IMPLANT
GOWN STRL REUS W/TWL LRG LVL3 (GOWN DISPOSABLE) IMPLANT
GOWN STRL REUS W/TWL XL LVL3 (GOWN DISPOSABLE) ×3 IMPLANT
KIT RM TURNOVER CYSTO AR (KITS) ×3 IMPLANT
LOOP CUTTING BIPOLAR 21FR (ELECTRODE) IMPLANT
PACK VAGINAL MINOR WOMEN LF (CUSTOM PROCEDURE TRAY) IMPLANT
PAD OB MATERNITY 4.3X12.25 (PERSONAL CARE ITEMS) ×3 IMPLANT
PAD PREP 24X48 CUFFED NSTRL (MISCELLANEOUS) ×3 IMPLANT
TOWEL OR 17X24 6PK STRL BLUE (TOWEL DISPOSABLE) ×6 IMPLANT
TUBING AQUILEX INFLOW (TUBING) ×3 IMPLANT
TUBING AQUILEX OUTFLOW (TUBING) ×3 IMPLANT
WATER STERILE IRR 1000ML POUR (IV SOLUTION) ×3 IMPLANT

## 2016-09-16 NOTE — Op Note (Signed)
NAMChauncy Passy:  Park, Tiffany Park               ACCOUNT NO.:  [id number]  MEDICAL RECORD NO.:  [id number]  LOCATION:                                 FACILITY:  PHYSICIAN:  Freddy FinnerW. Ronald Nakeysha Pasqual, M.D.        DATE OF BIRTH:  DATE OF PROCEDURE:  09/16/2016 DATE OF DISCHARGE:                              OPERATIVE REPORT   PREOPERATIVE DIAGNOSES:  Postmenopausal bleeding with ultrasound findings consistent with endometrial polyp.  POSTOPERATIVE DIAGNOSIS:  Endometrial polyp.  OPERATIVE PROCEDURE:  Hysteroscopy, D and C, and polypectomy.  SURGEON:  Freddy FinnerW. Ronald Levis Nazir, M.D.  ANESTHESIA:  General.  ESTIMATED INTRAOPERATIVE BLOOD LOSS:  Less than 50 mL.  INTRAOPERATIVE COMPLICATIONS:  None.  INDICATIONS:  The patient is a 56 year old postmenopausal female, on hormone replacement therapy, who has had irregular vaginal bleeding and had an ultrasound in the office recently which showed endometrial thickening with suggestion of endometrial polyps.  Additional finding showed some adenomyosis in the uterine wall.  Because of the bleeding in the polyp, she is admitted now for hysteroscopy, D and C.  DESCRIPTION OF PROCEDURE:  She was admitted on the morning of surgery, given a bolus of Ancef, brought to the operating room, placed under general anesthesia, placed in the dorsal lithotomy position using Allen stirrup system.  Betadine prep of mons perineum and vagina was carried out in usual fashion, and sterile drapes were applied.  Bivalve speculum was introduced.  Cervix was grasped with a single-tooth tenaculum.  The uterus was sounded to approximately 9.5 to 10 cm.  Cervix was grasped, progressively dilated with Pratt dilators to 23.  The hysteroscope was introduced, and the cavity visualized and photographed with findings of polyps.  Gentle thorough curettage with a Heaney curette followed by exploration with Randall stone forceps.  Repeat curettage and exploration with Randall stone forceps produced  complete sampling of the endometrium and removal of the polyps confirmed visually.  Photographs were made before and after the curettage.  At this point, the procedure was terminated.  Instruments removed.  The patient was awakened and taken to Recovery in good condition.  She was given 30 mg Toradol IV intraoperatively.     Freddy FinnerW. Ronald Devaunte Gasparini, M.D.     WRN/MEDQ  D:  09/16/2016  T:  09/16/2016  Job:  [id number]

## 2016-09-16 NOTE — Transfer of Care (Signed)
Immediate Anesthesia Transfer of Care Note  Patient: Tiffany Park  Procedure(s) Performed: Procedure(s): DILATATION AND CURETTAGE /HYSTEROSCOPY, resection of polyp (N/A)  Patient Location: PACU  Anesthesia Type:General  Level of Consciousness: awake, alert , oriented and patient cooperative  Airway & Oxygen Therapy: Patient Spontanous Breathing and Patient connected to nasal cannula oxygen  Post-op Assessment: Report given to RN and Post -op Vital signs reviewed and stable  Post vital signs: Reviewed and stable  Last Vitals:  Vitals:   09/16/16 0607  BP: (!) 92/54  Pulse: (!) 55  Resp: 18  Temp: 36.4 C    Last Pain:  Vitals:   09/16/16 0607  TempSrc: Oral      Patients Stated Pain Goal: 5 (09/16/16 0626)  Complications: No apparent anesthesia complications

## 2016-09-16 NOTE — Anesthesia Postprocedure Evaluation (Signed)
Anesthesia Post Note  Patient: Tiffany Park  Procedure(s) Performed: Procedure(s) (LRB): DILATATION AND CURETTAGE /HYSTEROSCOPY, resection of polyp (N/A)     Patient location during evaluation: PACU Anesthesia Type: General Level of consciousness: awake and alert Pain management: pain level controlled Vital Signs Assessment: post-procedure vital signs reviewed and stable Respiratory status: spontaneous breathing, nonlabored ventilation, respiratory function stable and patient connected to nasal cannula oxygen Cardiovascular status: blood pressure returned to baseline and stable Postop Assessment: no signs of nausea or vomiting Anesthetic complications: no    Last Vitals:  Vitals:   09/16/16 0900 09/16/16 0929  BP: 103/65 120/72  Pulse: (!) 47 (!) 43  Resp: 12 14  Temp:      Last Pain:  Vitals:   09/16/16 0830  TempSrc:   PainSc: Asleep                 Cassell Voorhies EDWARD

## 2016-09-16 NOTE — Discharge Instructions (Signed)
Post Anesthesia Home Care Instructions  Activity: Get plenty of rest for the remainder of the day. A responsible individual must stay with you for 24 hours following the procedure.  For the next 24 hours, DO NOT: -Drive a car -Advertising copywriterperate machinery -Drink alcoholic beverages -Take any medication unless instructed by your physician -Make any legal decisions or sign important papers.  Meals: Start with liquid foods such as gelatin or soup. Progress to regular foods as tolerated. Avoid greasy, spicy, heavy foods. If nausea and/or vomiting occur, drink only clear liquids until the nausea and/or vomiting subsides. Call your physician if vomiting continues.  Special Instructions/Symptoms: Your throat may feel dry or sore from the anesthesia or the breathing tube placed in your throat during surgery. If this causes discomfort, gargle with warm salt water. The discomfort should disappear within 24 hours.  If you had a scopolamine patch placed behind your ear for the management of post- operative nausea and/or vomiting:  1. The medication in the patch is effective for 72 hours, after which it should be removed.  Wrap patch in a tissue and discard in the trash. Wash hands thoroughly with soap and water. 2. You may remove the patch earlier than 72 hours if you experience unpleasant side effects which may include dry mouth, dizziness or visual disturbances. 3. Avoid touching the patch. Wash your hands with soap and water after contact with the patch.      DISCHARGE INSTRUCTIONS: HYSTEROSCOPY / ENDOMETRIAL ABLATION The following instructions have been prepared to help you care for yourself upon your return home.  May Remove Scop patch on or before  May take Ibuprofen after  May take stool softner while taking narcotic pain medication to prevent constipation.  Drink plenty of water.  Personal hygiene:  Use sanitary pads for vaginal drainage, not tampons.  Shower the day after your  procedure.  NO tub baths, pools or Jacuzzis for 2-3 weeks.  Wipe front to back after using the bathroom.  Activity and limitations:  Do NOT drive or operate any equipment for 24 hours. The effects of anesthesia are still present and drowsiness may result.  Do NOT rest in bed all day.  Walking is encouraged.  Walk up and down stairs slowly.  You may resume your normal activity in one to two days or as indicated by your physician. Sexual activity: NO intercourse for at least 2 weeks after the procedure, or as indicated by your Doctor.  Diet: Eat a light meal as desired this evening. You may resume your usual diet tomorrow.  Return to Work: You may resume your work activities in one to two days or as indicated by Therapist, sportsyour Doctor.  What to expect after your surgery: Expect to have vaginal bleeding/discharge for 2-3 days and spotting for up to 10 days. It is not unusual to have soreness for up to 1-2 weeks. You may have a slight burning sensation when you urinate for the first day. Mild cramps may continue for a couple of days. You may have a regular period in 2-6 weeks.  Call your doctor for any of the following:  Excessive vaginal bleeding or clotting, saturating and changing one pad every hour.  Inability to urinate 6 hours after discharge from hospital.  Pain not relieved by pain medication.  Fever of 100.4 F or greater.  Unusual vaginal discharge or odor.     D & C Home care Instructions:   Personal hygiene:  Used sanitary napkins for vaginal drainage not tampons. Shower  or tub bathe the day after your procedure. No douching until bleeding stops. Always wipe from front to back after  Elimination.  Activity: Do not drive or operate any equipment today. The effects of the anesthesia are still present and drowsiness may result. Rest today, not necessarily flat bed rest, just take it easy. You may resume your normal activity in one to 2 days.  Sexual activity: No  intercourse for one week or as indicated by your physician  Diet: Eat a light diet as desired this evening. You may resume a regular diet tomorrow.  Return to work: One to 2 days.  General Expectations of your surgery: Vaginal bleeding should be no heavier than a normal period. Spotting may continue up to 10 days. Mild cramps may continue for a couple of days. You may have a regular period in 2-6 weeks.  Unexpected observations call your doctor if these occur: persistent or heavy bleeding. Severe abdominal cramping or pain. Elevation of temperature greater than 100F.

## 2016-09-16 NOTE — Anesthesia Procedure Notes (Signed)
Procedure Name: LMA Insertion Date/Time: 09/16/2016 7:41 AM Performed by: Tyrone NineSAUVE, Shaylan Tutton F Pre-anesthesia Checklist: Patient identified, Timeout performed, Emergency Drugs available, Suction available and Patient being monitored Patient Re-evaluated:Patient Re-evaluated prior to induction Oxygen Delivery Method: Circle system utilized Preoxygenation: Pre-oxygenation with 100% oxygen Induction Type: IV induction Ventilation: Mask ventilation without difficulty LMA: LMA inserted LMA Size: 3.0 Number of attempts: 1 Placement Confirmation: breath sounds checked- equal and bilateral and CO2 detector Tube secured with: Tape Dental Injury: Teeth and Oropharynx as per pre-operative assessment

## 2016-09-17 ENCOUNTER — Encounter (HOSPITAL_BASED_OUTPATIENT_CLINIC_OR_DEPARTMENT_OTHER): Payer: Self-pay | Admitting: Obstetrics & Gynecology

## 2016-09-23 DIAGNOSIS — Z09 Encounter for follow-up examination after completed treatment for conditions other than malignant neoplasm: Secondary | ICD-10-CM | POA: Diagnosis not present

## 2016-09-25 MED FILL — XIIDRA 5% EYE DROPS: 5 | 30 days supply | Qty: 60 | Fill #2

## 2016-09-27 MED FILL — PROGESTERONE 100 MG CAPSULE: 100 | 90 days supply | Qty: 90 | Fill #0

## 2016-10-08 DIAGNOSIS — Z760 Encounter for issue of repeat prescription: Secondary | ICD-10-CM | POA: Diagnosis not present

## 2016-10-11 MED FILL — VIVELLE-DOT 0.1 MG PATCH: 0.1 | 28 days supply | Qty: 8 | Fill #3

## 2016-10-17 MED FILL — SYNTHROID 112 MCG TABLET: 112 | 90 days supply | Qty: 90 | Fill #3

## 2016-10-25 MED FILL — XIIDRA 5% EYE DROPS: 5 | 30 days supply | Qty: 60 | Fill #3

## 2016-10-31 DIAGNOSIS — H5213 Myopia, bilateral: Secondary | ICD-10-CM | POA: Diagnosis not present

## 2016-10-31 DIAGNOSIS — H524 Presbyopia: Secondary | ICD-10-CM | POA: Diagnosis not present

## 2016-11-13 MED FILL — VIVELLE-DOT 0.1 MG PATCH: 0.1 | 28 days supply | Qty: 8 | Fill #4

## 2016-11-18 MED FILL — XIIDRA 5% EYE DROPS: 5 | 30 days supply | Qty: 60 | Fill #4

## 2016-11-20 MED FILL — MECLIZINE 25 MG TABLET: 25 | 8 days supply | Qty: 30 | Fill #0

## 2016-12-06 MED FILL — VIVELLE-DOT 0.1 MG PATCH: 0.1 | 28 days supply | Qty: 8 | Fill #5

## 2016-12-10 DIAGNOSIS — H8143 Vertigo of central origin, bilateral: Secondary | ICD-10-CM | POA: Diagnosis not present

## 2016-12-10 DIAGNOSIS — H9313 Tinnitus, bilateral: Secondary | ICD-10-CM | POA: Diagnosis not present

## 2016-12-10 DIAGNOSIS — H903 Sensorineural hearing loss, bilateral: Secondary | ICD-10-CM | POA: Diagnosis not present

## 2016-12-10 DIAGNOSIS — H8101 Meniere's disease, right ear: Secondary | ICD-10-CM | POA: Diagnosis not present

## 2016-12-10 MED FILL — POTASSIUM CL ER 10 MEQ CAP: 10 | 60 days supply | Qty: 30 | Fill #0

## 2016-12-10 MED FILL — CHLORTHALIDONE 25 MG TABLET: 25 | 60 days supply | Qty: 30 | Fill #0

## 2016-12-19 MED FILL — XIIDRA 5% EYE DROPS: 5 | 30 days supply | Qty: 60 | Fill #5

## 2016-12-19 MED FILL — OLOPATADINE HCL 0.2 % SOLN: 0.2 | 25 days supply | Qty: 3 | Fill #1

## 2016-12-19 MED FILL — FLUOROMETHOLONE 0.1% DROPS: 0.1 | 25 days supply | Qty: 5 | Fill #1

## 2017-01-08 DIAGNOSIS — L814 Other melanin hyperpigmentation: Secondary | ICD-10-CM | POA: Diagnosis not present

## 2017-01-08 DIAGNOSIS — L940 Localized scleroderma [morphea]: Secondary | ICD-10-CM | POA: Diagnosis not present

## 2017-01-08 DIAGNOSIS — B078 Other viral warts: Secondary | ICD-10-CM | POA: Diagnosis not present

## 2017-01-08 DIAGNOSIS — L84 Corns and callosities: Secondary | ICD-10-CM | POA: Diagnosis not present

## 2017-01-08 DIAGNOSIS — L710 Perioral dermatitis: Secondary | ICD-10-CM | POA: Diagnosis not present

## 2017-01-08 MED FILL — metroNIDAZOLE 0.75 % LOTN: 0.75 | 30 days supply | Qty: 59 | Fill #0

## 2017-01-09 MED FILL — VIVELLE-DOT 0.1 MG PATCH: 0.1 | 28 days supply | Qty: 8 | Fill #6

## 2017-01-13 MED FILL — SYNTHROID 112 MCG TABLET: 112 | 90 days supply | Qty: 90 | Fill #4

## 2017-01-13 MED FILL — XIIDRA 5% EYE DROPS: 5 | 30 days supply | Qty: 60 | Fill #6

## 2017-02-05 MED FILL — VIVELLE-DOT 0.1 MG PATCH: 0.1 | 28 days supply | Qty: 8 | Fill #7

## 2017-02-06 MED FILL — XIIDRA 5% EYE DROPS: 5 | 30 days supply | Qty: 60 | Fill #7

## 2017-03-05 MED FILL — VIVELLE-DOT 0.1 MG PATCH: 0.1 | 28 days supply | Qty: 8 | Fill #8

## 2017-04-01 DIAGNOSIS — E738 Other lactose intolerance: Secondary | ICD-10-CM | POA: Diagnosis not present

## 2017-04-01 DIAGNOSIS — E7849 Other hyperlipidemia: Secondary | ICD-10-CM | POA: Diagnosis not present

## 2017-04-01 DIAGNOSIS — Z9189 Other specified personal risk factors, not elsewhere classified: Secondary | ICD-10-CM | POA: Diagnosis not present

## 2017-04-01 DIAGNOSIS — E038 Other specified hypothyroidism: Secondary | ICD-10-CM | POA: Diagnosis not present

## 2017-04-01 DIAGNOSIS — H04129 Dry eye syndrome of unspecified lacrimal gland: Secondary | ICD-10-CM | POA: Diagnosis not present

## 2017-04-01 DIAGNOSIS — M545 Low back pain: Secondary | ICD-10-CM | POA: Diagnosis not present

## 2017-04-01 DIAGNOSIS — H8103 Meniere's disease, bilateral: Secondary | ICD-10-CM | POA: Diagnosis not present

## 2017-04-01 DIAGNOSIS — Z1389 Encounter for screening for other disorder: Secondary | ICD-10-CM | POA: Diagnosis not present

## 2017-04-01 DIAGNOSIS — Z780 Asymptomatic menopausal state: Secondary | ICD-10-CM | POA: Diagnosis not present

## 2017-04-07 DIAGNOSIS — H04121 Dry eye syndrome of right lacrimal gland: Secondary | ICD-10-CM | POA: Diagnosis not present

## 2017-04-07 DIAGNOSIS — H04122 Dry eye syndrome of left lacrimal gland: Secondary | ICD-10-CM | POA: Diagnosis not present

## 2017-04-08 MED FILL — VIVELLE-DOT 0.1 MG PATCH: 0.1 | 28 days supply | Qty: 8 | Fill #9

## 2017-04-15 DIAGNOSIS — Z681 Body mass index (BMI) 19 or less, adult: Secondary | ICD-10-CM | POA: Diagnosis not present

## 2017-04-15 DIAGNOSIS — Z01419 Encounter for gynecological examination (general) (routine) without abnormal findings: Secondary | ICD-10-CM | POA: Diagnosis not present

## 2017-04-22 DIAGNOSIS — M545 Low back pain: Secondary | ICD-10-CM | POA: Diagnosis not present

## 2017-04-22 MED FILL — SYNTHROID 112 MCG TABLET: 112 | 30 days supply | Qty: 30 | Fill #0

## 2017-04-29 DIAGNOSIS — M545 Low back pain: Secondary | ICD-10-CM | POA: Diagnosis not present

## 2017-05-05 DIAGNOSIS — H04121 Dry eye syndrome of right lacrimal gland: Secondary | ICD-10-CM | POA: Diagnosis not present

## 2017-05-12 MED FILL — VIVELLE-DOT 0.1 MG PATCH: 0.1 | 28 days supply | Qty: 8 | Fill #10

## 2017-05-19 MED FILL — SYNTHROID 112 MCG TABLET: 112 | 30 days supply | Qty: 30 | Fill #1

## 2017-05-22 DIAGNOSIS — J322 Chronic ethmoidal sinusitis: Secondary | ICD-10-CM | POA: Diagnosis not present

## 2017-05-22 DIAGNOSIS — J301 Allergic rhinitis due to pollen: Secondary | ICD-10-CM | POA: Diagnosis not present

## 2017-05-22 DIAGNOSIS — H6522 Chronic serous otitis media, left ear: Secondary | ICD-10-CM | POA: Diagnosis not present

## 2017-05-22 DIAGNOSIS — J342 Deviated nasal septum: Secondary | ICD-10-CM | POA: Diagnosis not present

## 2017-05-22 DIAGNOSIS — J320 Chronic maxillary sinusitis: Secondary | ICD-10-CM | POA: Diagnosis not present

## 2017-05-22 MED FILL — AZITHROMYCIN 500 MG TABLET: 500 | 3 days supply | Qty: 3 | Fill #0

## 2017-06-02 DIAGNOSIS — H04121 Dry eye syndrome of right lacrimal gland: Secondary | ICD-10-CM | POA: Diagnosis not present

## 2017-06-06 MED FILL — VIVELLE-DOT 0.1 MG PATCH: 0.1 | 28 days supply | Qty: 8 | Fill #11

## 2017-06-16 MED FILL — SYNTHROID 112 MCG TABLET: 112 | 30 days supply | Qty: 30 | Fill #2

## 2017-06-30 MED FILL — DOXYCYCLINE HYCLATE 100 MG: 100 | 10 days supply | Qty: 20 | Fill #0

## 2017-07-10 ENCOUNTER — Other Ambulatory Visit (HOSPITAL_COMMUNITY): Payer: Self-pay | Admitting: Neurosurgery

## 2017-07-10 ENCOUNTER — Ambulatory Visit (HOSPITAL_COMMUNITY)
Admission: RE | Admit: 2017-07-10 | Discharge: 2017-07-10 | Disposition: A | Discharge status: Home / Self Care | Payer: 59 | Source: Ambulatory Visit | Attending: Neurosurgery | Admitting: Neurosurgery

## 2017-07-10 DIAGNOSIS — M4802 Spinal stenosis, cervical region: Secondary | ICD-10-CM | POA: Insufficient documentation

## 2017-07-10 DIAGNOSIS — M4722 Other spondylosis with radiculopathy, cervical region: Secondary | ICD-10-CM | POA: Insufficient documentation

## 2017-07-10 DIAGNOSIS — M5412 Radiculopathy, cervical region: Secondary | ICD-10-CM | POA: Diagnosis not present

## 2017-07-10 DIAGNOSIS — R200 Anesthesia of skin: Secondary | ICD-10-CM

## 2017-07-10 DIAGNOSIS — R202 Paresthesia of skin: Secondary | ICD-10-CM | POA: Diagnosis not present

## 2017-07-10 DIAGNOSIS — Z681 Body mass index (BMI) 19 or less, adult: Secondary | ICD-10-CM | POA: Diagnosis not present

## 2017-07-10 DIAGNOSIS — M50223 Other cervical disc displacement at C6-C7 level: Secondary | ICD-10-CM | POA: Diagnosis not present

## 2017-07-10 MED FILL — SYNTHROID 112 MCG TABLET: 112 | 30 days supply | Qty: 30 | Fill #3

## 2017-07-15 DIAGNOSIS — R200 Anesthesia of skin: Secondary | ICD-10-CM | POA: Diagnosis not present

## 2017-07-15 DIAGNOSIS — G5621 Lesion of ulnar nerve, right upper limb: Secondary | ICD-10-CM | POA: Diagnosis not present

## 2017-07-16 ENCOUNTER — Other Ambulatory Visit: Payer: Self-pay | Admitting: Obstetrics & Gynecology

## 2017-07-16 DIAGNOSIS — R200 Anesthesia of skin: Secondary | ICD-10-CM | POA: Diagnosis not present

## 2017-07-16 DIAGNOSIS — G5621 Lesion of ulnar nerve, right upper limb: Secondary | ICD-10-CM | POA: Diagnosis not present

## 2017-07-16 DIAGNOSIS — Z681 Body mass index (BMI) 19 or less, adult: Secondary | ICD-10-CM | POA: Diagnosis not present

## 2017-07-16 DIAGNOSIS — Z1231 Encounter for screening mammogram for malignant neoplasm of breast: Secondary | ICD-10-CM

## 2017-07-16 DIAGNOSIS — M5412 Radiculopathy, cervical region: Secondary | ICD-10-CM | POA: Diagnosis not present

## 2017-07-18 MED FILL — VIVELLE-DOT 0.1 MG PATCH: 0.1 | 28 days supply | Qty: 8 | Fill #0

## 2017-08-05 ENCOUNTER — Encounter: Payer: Self-pay | Admitting: Physical Therapy

## 2017-08-05 ENCOUNTER — Other Ambulatory Visit: Payer: Self-pay

## 2017-08-05 ENCOUNTER — Ambulatory Visit: Payer: 59 | Attending: Neurosurgery | Admitting: Physical Therapy

## 2017-08-05 DIAGNOSIS — M6281 Muscle weakness (generalized): Secondary | ICD-10-CM | POA: Insufficient documentation

## 2017-08-05 DIAGNOSIS — M5412 Radiculopathy, cervical region: Secondary | ICD-10-CM | POA: Insufficient documentation
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
## 2017-08-05 NOTE — Patient Instructions (Addendum)
    Deep cervical flexors:  Slight nod without activating front neck muscles   Over Head Pull: Narrow Grip       On back, knees bent, feet flat, band across thighs, elbows straight but relaxed. Pull hands apart (start). Keeping elbows straight, bring arms up and over head, hands toward floor. Keep pull steady on band. Hold momentarily. Return slowly, keeping pull steady, back to start. Repeat __10_ times. Band color ___red___   Side Pull: Double Arm   On back, knees bent, feet flat. Arms perpendicular to body, shoulder level, elbows straight but relaxed. Pull arms out to sides, elbows straight. Resistance band comes across collarbones, hands toward floor. Hold momentarily. Slowly return to starting position. Repeat __10_ times. Band color ___red__   Elisabeth CaraSash   On back, knees bent, feet flat, left hand on left hip, right hand above left. Pull right arm DIAGONALLY (hip to shoulder) across chest. Bring right arm along head toward floor. Hold momentarily. Slowly return to starting position. Repeat _10__ times. Do with left arm. Band color ___red___   Shoulder Rotation: Double Arm   On back, knees bent, feet flat, elbows tucked at sides, bent 90, hands palms up. Pull hands apart and down toward floor, keeping elbows near sides. Hold momentarily. Slowly return to starting position. Repeat _10__ times. Band color ___red___     Lavinia SharpsStacy Hager [place] PT [facility] [address], [address] [place], [place] [address] Phone # [phone] Fax [phone]

## 2017-08-06 ENCOUNTER — Ambulatory Visit
Admission: RE | Admit: 2017-08-06 | Discharge: 2017-08-06 | Disposition: A | Discharge status: Home / Self Care | Payer: 59 | Source: Ambulatory Visit | Attending: Obstetrics & Gynecology | Admitting: Obstetrics & Gynecology

## 2017-08-06 DIAGNOSIS — M545 Low back pain: Secondary | ICD-10-CM | POA: Diagnosis not present

## 2017-08-06 DIAGNOSIS — Z1231 Encounter for screening mammogram for malignant neoplasm of breast: Secondary | ICD-10-CM | POA: Diagnosis not present

## 2017-08-06 DIAGNOSIS — M546 Pain in thoracic spine: Secondary | ICD-10-CM | POA: Diagnosis not present

## 2017-08-07 ENCOUNTER — Other Ambulatory Visit: Payer: Self-pay | Admitting: Sports Medicine

## 2017-08-07 ENCOUNTER — Ambulatory Visit: Payer: 59 | Admitting: Physical Therapy

## 2017-08-08 MED FILL — diazePAM 5 MG TABS: 5 | 30 days supply | Qty: 30 | Fill #0

## 2017-08-11 MED FILL — SYNTHROID 112 MCG TABLET: 112 | 30 days supply | Qty: 30 | Fill #4

## 2017-08-14 ENCOUNTER — Encounter

## 2017-08-14 MED FILL — VIVELLE-DOT 0.1 MG PATCH: 0.1 | 28 days supply | Qty: 8 | Fill #1

## 2017-08-19 ENCOUNTER — Ambulatory Visit: Payer: 59 | Admitting: Physical Therapy

## 2017-08-19 ENCOUNTER — Encounter: Payer: Self-pay | Admitting: Physical Therapy

## 2017-08-19 DIAGNOSIS — M6281 Muscle weakness (generalized): Secondary | ICD-10-CM | POA: Diagnosis not present

## 2017-08-19 DIAGNOSIS — M5412 Radiculopathy, cervical region: Secondary | ICD-10-CM | POA: Diagnosis not present

## 2017-08-19 DIAGNOSIS — M545 Low back pain: Secondary | ICD-10-CM | POA: Diagnosis not present

## 2017-08-19 NOTE — Patient Instructions (Signed)
    Lying on your back:  1) first tuck/nod, then lift head slightly, then side to side rotate   2) Extensors, Supine   Copyright  [employer]. All rights reserved.      Hands/knees position:  1) Use green band behind head, lift up and do chin tuck at the top 5x  2) Hinge back 1/2 way back to the heels,  Then cat/cow 5x      Lie face down off the corner of the bed:  1) Lift arms out to the side  Thumbs up 5x        Chin tuck at the top  2)  Lift arms Y direction thumbs up 5       Lacrosse ball :   1) Put behind back at levator muscle and turn head away and look down at your armpit 5x   2)Ball is high behind suboccipitals and rotate to the same side 5x        Lavinia SharpsStacy Issai Werling PT [facility] [address], [address] [place], [place] [address] Phone # [phone] Fax [phone]
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
## 2017-08-26 ENCOUNTER — Encounter: Payer: 59 | Admitting: Physical Therapy

## 2017-09-09 ENCOUNTER — Encounter: Payer: Self-pay | Admitting: Physical Therapy

## 2017-09-09 ENCOUNTER — Ambulatory Visit: Payer: 59 | Attending: Neurosurgery | Admitting: Physical Therapy

## 2017-09-09 DIAGNOSIS — M5412 Radiculopathy, cervical region: Secondary | ICD-10-CM | POA: Insufficient documentation

## 2017-09-09 DIAGNOSIS — M6281 Muscle weakness (generalized): Secondary | ICD-10-CM | POA: Diagnosis not present

## 2017-09-09 NOTE — Patient Instructions (Signed)
       Try exercises over the ball:  Look straight down at the floor, then a slight tuck "double chin"                                               Lift arms thumbs up        Ball against wall:  1) facing away head in neutral with EARS OVER YOUR SHOULDERS:  Small head rotations side to side        2)  Facing wall with ball on wall with EARS OVER YOUR SHOULDERS:   Small rotations side to side      SHARAPOVA'S;  Red band around hands with small backwards Cs 3 up and down the wall 10x       Nerve flossing per diagram     Lavinia SharpsStacy Simpson PT [facility] [address], [address] [place], [place] [address] Phone # [phone] Fax [phone]

## 2017-09-09 NOTE — Therapy (Signed)
[facility] [address] [place], [place], [address] Phone: [phone]   Fax:  [phone]  Physical Therapy Treatment  Patient Details  Name: Tiffany Park MRN: [id number] Date of Birth: 02-25-1961 Referring Provider: Dr. Vertell Limber   Encounter Date: 09/09/2017  PT End of Session - 09/09/17 0929    Visit Number  3    Date for PT Re-Evaluation  09/30/17    Authorization Type  UMR    PT Start Time  0802    PT Stop Time  0846    PT Time Calculation (min)  44 min    Activity Tolerance  Patient tolerated treatment well       Past Medical History:  Diagnosis Date  . Adnexal mass    2012  . Gluten intolerance    cuaes diarrhea, stomach cramps  . Raynaud disease   . Scleroderma [facility])     Past Surgical History:  Procedure Laterality Date  . colonscopy  jan 2012  . DILATION AND CURETTAGE OF UTERUS  1990  . HYSTEROSCOPY W/D&C N/A 09/16/2016   Procedure: DILATATION AND CURETTAGE /HYSTEROSCOPY, resection of polyp;  Surgeon: Maisie Fus, MD;  Location: [facility];  Service: Gynecology;  Laterality: N/A;  . LAPAROSCOPIC SALPINGOOPHERECTOMY  2012   left  . TONSILLECTOMY  age 35  . UPPER GASTROINTESTINAL ENDOSCOPY  jan 2012    There were no vitals filed for this visit.  Subjective Assessment - 09/09/17 0804    Subjective  Feeling pretty good.  On Saturday, had some mild right 4th finger and 5th finger and thumb.      Currently in Pain?  No/denies    Pain Score  0-No pain         [facility] PT Assessment - 09/09/17 0001      AROM   Cervical Flexion  40    Cervical - Right Side Bend  45    Cervical - Left Side Bend  40                   [facility] Adult PT Treatment/Exercise - 09/09/17 0001      Neuro Re-ed    Neuro Re-ed Details   deep cervical flexors and extensor activation       Neck Exercises: Standing   Upper Extremity Flexion with Stabilization  -- neural flossing with same side  head sidebend 10x    Other Standing Exercises  face away from wall push head into ball with small rotation 10x    Other Standing Exercises  forehead push into ball with small rotation 10x      Neck Exercises: Prone   Other Prone Exercise  head off table with arm lift 5x    Other Prone Exercise  over green ball with arm lift thumbs up 10x             PT Education - 09/09/17 0847    Education Details   Access Code: [email or web address] neural flossing, standing cervical stabilization     Person(s) Educated  Patient    Methods  Explanation;Demonstration;Handout    Comprehension  Returned demonstration;Verbalized understanding       PT Short Term Goals - 09/09/17 1733      PT SHORT TERM GOAL #1   Title  The patient will be able to activate deep cervical flexors in supine and sitting with minimal cues    Status  Achieved      PT SHORT TERM GOAL #2  Title  The patient will report no distal symptoms with return to the majority of her usual ADLs     Status  Achieved      PT SHORT TERM GOAL #3   Title  Improved cervical flexion ROM to 40 degrees, sidebending to 43 degrees needed to return to tennis    Status  Achieved        PT Long Term Goals - 08/05/17 2044      PT LONG TERM GOAL #1   Title  The patient will be independent in safe self progression of HEP    Time  8    Period  Weeks    Status  New    Target Date  09/30/17      PT LONG TERM GOAL #2   Title  The patient will report no peripheral symptoms with return to tennis and with resistive exercises    Time  8    Period  Weeks    Status  New      PT LONG TERM GOAL #3   Title  The patient will have improved cervical muscle strength to 4 to 4+/5 and scapular muscle strength to 4+/5 needed for safe return to tennis     Time  8    Period  Weeks    Status  New      PT LONG TERM GOAL #4   Title  FOTO functional outcome score improved from 43% limitation to 30% indicating improved function with less pain    Time  8    Period   Weeks    Status  New            Plan - 09/09/17 1728    Clinical Impression Statement  The patient is progressing will with cervical stabilization and was able to progress to an upright position today.  No symptoms produced although she did have numbness/tingling in her fingers upon awakening Saturday.  Relieved with chin tucks and was able to play tennis later without reproducing symptoms.   Added neural flossing without symptoms produced.  Verbal and tactile cues needed for neutral head position.   All STGs met.   Patient's goal is to return to exercising with weights.  She is fearful to try yet since she associates this with causing her symptoms initially.      Rehab Potential  Good    Clinical Impairments Affecting Rehab Potential  osteopenia;  chronic history of LBP and neck pain;  HS tear    PT Frequency  1x / week    PT Duration  8 weeks    PT Treatment/Interventions  Electrical Stimulation;Ultrasound;Traction;Moist Heat;Therapeutic activities;Therapeutic exercise;Patient/family education;Neuromuscular re-education;Manual techniques;Dry needling;Taping    PT Next Visit Plan  assess responset to  median nerve glides and try side bend away;  review standing cervical stabilization;  add more dynamic upright movements;  try light weights;   review previous HEP as needed    PT Home Exercise Plan  Access Code: [email or web address]       Patient will benefit from skilled therapeutic intervention in order to improve the following deficits and impairments:  Postural dysfunction, Decreased range of motion, Decreased strength, Pain  Visit Diagnosis: Radiculopathy, cervical region  Muscle weakness (generalized)     Problem List Patient Active Problem List   Diagnosis Date Noted  . Shoulder pain, left 10/10/2015  . Left knee pain 10/10/2015  . Somatic dysfunction of spine, lumbar 02/10/2014  . Nonallopathic lesion of cervical region 10/15/2012  .  Nonallopathic lesion of lumbosacral region  10/15/2012  . 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 Nonallopathic lesion of thoracic region 02/05/2012  . Back pain 01/28/2012  . Mass of ovary 02/13/2011  . Avulsion of hamstring muscle 11/13/2010  . Hamstring strain 11/13/2010  . Elbow pain, right 10/02/2010  . Lateral epicondylitis of right elbow 10/02/2010  . Right knee pain 10/02/2010   Ruben Im, PT 09/09/17 5:35 PM Phone: [phone] Fax: [phone]  Alvera Singh 09/09/2017, 5:35 PM  [facility] [address] [place], [place], [address] Phone: [phone]   Fax:  [phone]  Name: Tiffany Park MRN: [id number] Date of Birth: 08/10/1960

## 2017-09-12 MED FILL — VIVELLE-DOT 0.1 MG PATCH: 0.1 | 28 days supply | Qty: 8 | Fill #2

## 2017-09-15 MED FILL — SYNTHROID 112 MCG TABLET: 112 | 30 days supply | Qty: 30 | Fill #5

## 2017-09-25 ENCOUNTER — Encounter: Payer: Self-pay | Admitting: Physical Therapy

## 2017-09-25 ENCOUNTER — Ambulatory Visit: Payer: 59 | Attending: Neurosurgery | Admitting: Physical Therapy

## 2017-09-25 DIAGNOSIS — M6281 Muscle weakness (generalized): Secondary | ICD-10-CM | POA: Diagnosis not present

## 2017-09-25 DIAGNOSIS — M5412 Radiculopathy, cervical region: Secondary | ICD-10-CM | POA: Insufficient documentation

## 2017-09-25 NOTE — Patient Instructions (Addendum)
      Access Code: [email or web address]  URL: [email or web address]  Date: 09/25/2017  Prepared by: Lavinia SharpsStacy Simpson   Exercises  Median Nerve Flossing - Tray - 10 reps - 1 sets - 2x daily - 7x weekly  Median Nerve Tensioner - 10 reps - 1 sets - 1x daily - 7x weekly  Supine Shoulder Horizontal Abduction with Dumbbells - 10 reps - 3 sets - 1x daily - 7x weekly  Supine Chest Press with Dumbbells - 10 reps - 3 sets - 1x daily - 7x weekly  Standing Shoulder Flexion with Dumbbells - 10 reps - 1 sets - 1x daily - 7x weekly  Standing Shoulder Horizontal Abduction with Dumbbells - Thumbs Up - 10 reps - 3 sets - 1x daily - 7x weekly
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
## 2017-09-26 DIAGNOSIS — M545 Low back pain: Secondary | ICD-10-CM | POA: Diagnosis not present

## 2017-10-02 DIAGNOSIS — E038 Other specified hypothyroidism: Secondary | ICD-10-CM | POA: Diagnosis not present

## 2017-10-03 DIAGNOSIS — M545 Low back pain: Secondary | ICD-10-CM | POA: Diagnosis not present

## 2017-10-03 DIAGNOSIS — M546 Pain in thoracic spine: Secondary | ICD-10-CM | POA: Diagnosis not present

## 2017-10-09 MED FILL — VIVELLE-DOT 0.1 MG PATCH: 0.1 | 28 days supply | Qty: 8 | Fill #3

## 2017-10-20 MED FILL — SYNTHROID 112 MCG TABLET: 112 | 90 days supply | Qty: 90 | Fill #0

## 2017-10-22 DIAGNOSIS — M546 Pain in thoracic spine: Secondary | ICD-10-CM | POA: Diagnosis not present

## 2017-10-22 DIAGNOSIS — M545 Low back pain: Secondary | ICD-10-CM | POA: Diagnosis not present

## 2017-10-23 ENCOUNTER — Encounter: Payer: 59 | Admitting: Physical Therapy

## 2017-10-24 DIAGNOSIS — R300 Dysuria: Secondary | ICD-10-CM | POA: Diagnosis not present

## 2017-10-29 ENCOUNTER — Encounter: Payer: Self-pay | Admitting: Sports Medicine

## 2017-10-29 ENCOUNTER — Ambulatory Visit (INDEPENDENT_AMBULATORY_CARE_PROVIDER_SITE_OTHER): Payer: 59 | Admitting: Sports Medicine

## 2017-10-29 VITALS — BP 80/50 | Ht 62.0 in | Wt 100.0 lb

## 2017-10-29 DIAGNOSIS — M25521 Pain in right elbow: Secondary | ICD-10-CM

## 2017-10-29 DIAGNOSIS — M7712 Lateral epicondylitis, left elbow: Secondary | ICD-10-CM

## 2017-10-29 MED ORDER — NITROGLYCERIN 0.2 MG/HR TD PT24: MEDICATED_PATCH | TRANSDERMAL | 1 refills | Status: DC

## 2017-10-29 MED FILL — NITROGLYCERIN 0.2 MG/HR PTC: 0.2 | 28 days supply | Qty: 7 | Fill #0

## 2017-10-29 NOTE — Progress Notes (Signed)
   HPI  CC: Left elbow pain  Tiffany Park is a 57 year old female who presents today for right elbow pain.  She states that changed the way she served, and her lateral elbow on the right side has been hurting since that time.  She states she has been resting it and not been playing tennis since the onset.  She is been using Voltaren cream and leftover nitroglycerin patches from the last time she had this issue.  She states this has improved the pain.  She denies any numbness and tingling down her arm.  She denies any radiating pain.  She denies any weakness in the hand.  She states she had a similar issue several years ago, which was treated with nitroglycerin patch.  She has no new trauma to the area.  She does not remember a specific inciting event.  See HPI and/or previous note for associated ROS.  Objective: BP (!) 80/50   Ht 5\' 2"  (1.575 m)   Wt 100 lb (45.4 kg)   BMI 18.29 kg/m  Gen: Right-Hand Dominant. NAD, well groomed, a/o x3, normal affect.  CV: Well-perfused. Warm.  Resp: Non-labored.  Neuro: Sensation intact throughout. No gross coordination deficits.   Left elbow exam: No erythema, warmth, swelling noted.  Tenderness palpation along the lateral epicondyle.  Full range of motion in flexion and extension of the elbow.  5 out of 5 strength testing throughout upper extremity.  Negative UCL laxity testing.  Assessment and plan:  Lateral epicondylitis, right side  We discussed with Ronata the treatment options today.  We have given her the Nitropatch protocol, and told her this could take up to 2 months to see some improvement.  We have also advised her that she can use a band over the affected area for some relief.  We will send in a referral for physical therapy at this time to work on exercises for rehabilitation of the elbow.  We will see her back in 6 to 8 weeks for follow-up.  She has no improvement that time, we can consider doing a corticosteroid injection.  Alric Quan,  MD [facility] Fellow 10/29/2017 11:32 AM

## 2017-10-29 NOTE — Patient Instructions (Signed)
Take you for come to see [place] in clinic today.  You are diagnosed with lateral epicondylitis of your left elbow.  We have given you nitroglycerin patch protocol.  This will take around 2 months to show some improvement.  We have also given you referral to physical therapy to work on exercises to rehabilitate the elbow.  We will see back in clinic in 8 weeks for follow-up.

## 2017-10-30 ENCOUNTER — Encounter: Payer: Self-pay | Admitting: Physical Therapy

## 2017-10-30 ENCOUNTER — Other Ambulatory Visit: Payer: Self-pay

## 2017-10-30 ENCOUNTER — Ambulatory Visit: Payer: 59 | Attending: Sports Medicine | Admitting: Physical Therapy

## 2017-10-30 ENCOUNTER — Ambulatory Visit: Payer: 59 | Admitting: Physical Therapy

## 2017-10-30 DIAGNOSIS — M6281 Muscle weakness (generalized): Secondary | ICD-10-CM | POA: Diagnosis not present

## 2017-10-30 DIAGNOSIS — M25521 Pain in right elbow: Secondary | ICD-10-CM | POA: Diagnosis not present

## 2017-10-30 NOTE — Patient Instructions (Addendum)
Medbridge: [id number]   Wrist eccentrics with red band;    Supination red band     Triceps extension red band
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
## 2017-11-05 MED FILL — VIVELLE-DOT 0.1 MG PATCH: 0.1 | 28 days supply | Qty: 8 | Fill #4

## 2017-11-14 ENCOUNTER — Ambulatory Visit: Payer: 59 | Admitting: Physical Therapy

## 2017-11-20 ENCOUNTER — Encounter: Payer: 59 | Admitting: Physical Therapy

## 2017-11-21 ENCOUNTER — Encounter: Payer: 59 | Admitting: Physical Therapy

## 2017-12-08 MED FILL — VIVELLE-DOT 0.1 MG PATCH: 0.1 | 28 days supply | Qty: 8 | Fill #5

## 2018-01-06 MED FILL — VIVELLE-DOT 0.1 MG PATCH: 0.1 | 28 days supply | Qty: 8 | Fill #6

## 2018-01-19 DIAGNOSIS — G5621 Lesion of ulnar nerve, right upper limb: Secondary | ICD-10-CM | POA: Diagnosis not present

## 2018-01-19 DIAGNOSIS — Z681 Body mass index (BMI) 19 or less, adult: Secondary | ICD-10-CM | POA: Diagnosis not present

## 2018-01-19 DIAGNOSIS — G5603 Carpal tunnel syndrome, bilateral upper limbs: Secondary | ICD-10-CM | POA: Diagnosis not present

## 2018-01-19 DIAGNOSIS — M5412 Radiculopathy, cervical region: Secondary | ICD-10-CM | POA: Diagnosis not present

## 2018-01-19 DIAGNOSIS — R200 Anesthesia of skin: Secondary | ICD-10-CM | POA: Diagnosis not present

## 2018-01-19 MED FILL — SYNTHROID 112 MCG TABLET: 112 | 90 days supply | Qty: 90 | Fill #1

## 2018-01-20 ENCOUNTER — Other Ambulatory Visit: Payer: Self-pay | Admitting: Sports Medicine

## 2018-01-20 MED FILL — DICLOFENAC SODIUM 1% GEL: 1 | 25 days supply | Qty: 100 | Fill #0

## 2018-01-27 MED FILL — CLOBETASOL PROPIONATE 0.05: 0.05 | 10 days supply | Qty: 60 | Fill #0

## 2018-01-29 MED FILL — OLOPATADINE HCL 0.2% EYE DR: 0.2 | 25 days supply | Qty: 3 | Fill #0

## 2018-01-29 MED FILL — FLUOROMETHOLONE 0.1% DROPS: 0.1 | 25 days supply | Qty: 5 | Fill #0

## 2018-02-05 MED FILL — VIVELLE-DOT 0.1 MG PATCH: 0.1 | 84 days supply | Qty: 24 | Fill #7

## 2018-02-09 DIAGNOSIS — H6122 Impacted cerumen, left ear: Secondary | ICD-10-CM | POA: Diagnosis not present

## 2018-02-09 DIAGNOSIS — J3489 Other specified disorders of nose and nasal sinuses: Secondary | ICD-10-CM | POA: Diagnosis not present

## 2018-02-09 MED FILL — MUPIROCIN 2% CREAM: 2 | 5 days supply | Qty: 30 | Fill #0

## 2018-02-17 MED FILL — CLOBETASOL PROPIONATE 0.05: 0.05 | 14 days supply | Qty: 60 | Fill #0

## 2018-02-17 MED FILL — DICLOFENAC SODIUM 1 % GEL: 1 | 25 days supply | Qty: 100 | Fill #1

## 2018-02-19 DIAGNOSIS — B078 Other viral warts: Secondary | ICD-10-CM | POA: Diagnosis not present

## 2018-02-19 DIAGNOSIS — B070 Plantar wart: Secondary | ICD-10-CM | POA: Diagnosis not present

## 2018-02-19 DIAGNOSIS — L84 Corns and callosities: Secondary | ICD-10-CM | POA: Diagnosis not present

## 2018-03-27 DIAGNOSIS — R82998 Other abnormal findings in urine: Secondary | ICD-10-CM | POA: Diagnosis not present

## 2018-03-27 DIAGNOSIS — E038 Other specified hypothyroidism: Secondary | ICD-10-CM | POA: Diagnosis not present

## 2018-03-27 DIAGNOSIS — Z0000 Encounter for general adult medical examination without abnormal findings: Secondary | ICD-10-CM | POA: Diagnosis not present

## 2018-04-03 DIAGNOSIS — E7849 Other hyperlipidemia: Secondary | ICD-10-CM | POA: Diagnosis not present

## 2018-04-03 DIAGNOSIS — E038 Other specified hypothyroidism: Secondary | ICD-10-CM | POA: Diagnosis not present

## 2018-04-03 DIAGNOSIS — Z9189 Other specified personal risk factors, not elsewhere classified: Secondary | ICD-10-CM | POA: Diagnosis not present

## 2018-04-03 DIAGNOSIS — Z780 Asymptomatic menopausal state: Secondary | ICD-10-CM | POA: Diagnosis not present

## 2018-04-03 DIAGNOSIS — H8109 Meniere's disease, unspecified ear: Secondary | ICD-10-CM | POA: Diagnosis not present

## 2018-04-03 DIAGNOSIS — N393 Stress incontinence (female) (male): Secondary | ICD-10-CM | POA: Diagnosis not present

## 2018-04-03 DIAGNOSIS — E739 Lactose intolerance, unspecified: Secondary | ICD-10-CM | POA: Diagnosis not present

## 2018-04-03 DIAGNOSIS — R636 Underweight: Secondary | ICD-10-CM | POA: Diagnosis not present

## 2018-04-03 DIAGNOSIS — Z0000 Encounter for general adult medical examination without abnormal findings: Secondary | ICD-10-CM | POA: Diagnosis not present

## 2018-04-03 MED FILL — SHINGRIX 50 MCG SUS: 50 | 1 days supply | Qty: 1 | Fill #0

## 2018-04-06 ENCOUNTER — Other Ambulatory Visit: Payer: Self-pay | Admitting: Internal Medicine

## 2018-04-06 DIAGNOSIS — Z1339 Encounter for screening examination for other mental health and behavioral disorders: Secondary | ICD-10-CM | POA: Diagnosis not present

## 2018-04-06 DIAGNOSIS — Z1331 Encounter for screening for depression: Secondary | ICD-10-CM | POA: Diagnosis not present

## 2018-04-06 DIAGNOSIS — E785 Hyperlipidemia, unspecified: Secondary | ICD-10-CM

## 2018-04-08 DIAGNOSIS — Z719 Counseling, unspecified: Secondary | ICD-10-CM

## 2018-04-13 MED FILL — AMOXICILLIN 500 MG CAPSULE: 500 | 7 days supply | Qty: 21 | Fill #0

## 2018-04-14 DIAGNOSIS — H8102 Meniere's disease, left ear: Secondary | ICD-10-CM | POA: Diagnosis not present

## 2018-04-14 DIAGNOSIS — H9042 Sensorineural hearing loss, unilateral, left ear, with unrestricted hearing on the contralateral side: Secondary | ICD-10-CM | POA: Diagnosis not present

## 2018-04-15 ENCOUNTER — Ambulatory Visit
Admission: RE | Admit: 2018-04-15 | Discharge: 2018-04-15 | Disposition: A | Discharge status: Home / Self Care | Payer: 59 | Source: Ambulatory Visit | Attending: Internal Medicine | Admitting: Internal Medicine

## 2018-04-15 DIAGNOSIS — E785 Hyperlipidemia, unspecified: Secondary | ICD-10-CM

## 2018-04-16 DIAGNOSIS — Z681 Body mass index (BMI) 19 or less, adult: Secondary | ICD-10-CM | POA: Diagnosis not present

## 2018-04-16 DIAGNOSIS — Z01419 Encounter for gynecological examination (general) (routine) without abnormal findings: Secondary | ICD-10-CM | POA: Diagnosis not present

## 2018-04-17 MED FILL — FLUCONAZOLE 150 MG TABS: 150 | 6 days supply | Qty: 3 | Fill #0

## 2018-04-21 MED FILL — SYNTHROID 112 MCG TABLET: 112 | 90 days supply | Qty: 90 | Fill #2

## 2018-04-30 DIAGNOSIS — H524 Presbyopia: Secondary | ICD-10-CM | POA: Diagnosis not present

## 2018-04-30 DIAGNOSIS — H5213 Myopia, bilateral: Secondary | ICD-10-CM | POA: Diagnosis not present

## 2018-05-15 MED FILL — VIVELLE-DOT 0.1 MG PATCH: 0.1 | 28 days supply | Qty: 8 | Fill #8 | Status: TO

## 2018-06-06 MED FILL — VIVELLE-DOT 0.1 MG PATCH: 0.1 | 28 days supply | Qty: 8 | Fill #0

## 2018-07-01 MED FILL — VIVELLE-DOT 0.1 MG PATCH: 0.1 | 84 days supply | Qty: 24 | Fill #0

## 2018-07-15 MED FILL — SYNTHROID 112 MCG TABLET: 112 | 90 days supply | Qty: 90 | Fill #0

## 2018-09-03 MED FILL — SHINGRIX 50 MCG SUS: 50 | 1 days supply | Qty: 1 | Fill #1

## 2018-09-18 MED FILL — SHINGRIX 50 MCG SUS: 50 | 1 days supply | Qty: 1 | Fill #1

## 2018-09-30 MED FILL — VIVELLE-DOT 0.1 MG PATCH: 0.1 | 84 days supply | Qty: 24 | Fill #0

## 2018-10-01 DIAGNOSIS — Z20828 Contact with and (suspected) exposure to other viral communicable diseases: Secondary | ICD-10-CM | POA: Diagnosis not present

## 2018-10-08 DIAGNOSIS — Z719 Counseling, unspecified: Secondary | ICD-10-CM

## 2018-10-19 MED FILL — SYNTHROID 112 MCG TABLET: 112 | 90 days supply | Qty: 90 | Fill #0

## 2018-10-29 ENCOUNTER — Other Ambulatory Visit: Payer: Self-pay | Admitting: Internal Medicine

## 2018-10-29 DIAGNOSIS — R911 Solitary pulmonary nodule: Secondary | ICD-10-CM

## 2018-10-29 MED FILL — DOTTI 0.1 MG/24HR PTTW: 0.1 | 28 days supply | Qty: 8 | Fill #0

## 2018-11-16 ENCOUNTER — Other Ambulatory Visit: Payer: Self-pay | Admitting: Family Medicine

## 2018-11-16 ENCOUNTER — Other Ambulatory Visit: Payer: Self-pay | Admitting: Obstetrics & Gynecology

## 2018-11-16 DIAGNOSIS — Z1231 Encounter for screening mammogram for malignant neoplasm of breast: Secondary | ICD-10-CM

## 2018-11-23 ENCOUNTER — Ambulatory Visit
Admission: RE | Admit: 2018-11-23 | Discharge: 2018-11-23 | Disposition: A | Discharge status: Home / Self Care | Payer: 59 | Source: Ambulatory Visit | Attending: Internal Medicine | Admitting: Internal Medicine

## 2018-11-23 DIAGNOSIS — R918 Other nonspecific abnormal finding of lung field: Secondary | ICD-10-CM | POA: Diagnosis not present

## 2018-11-23 DIAGNOSIS — R911 Solitary pulmonary nodule: Secondary | ICD-10-CM

## 2018-12-21 MED FILL — diazePAM 5 MG TABS: 5 | 30 days supply | Qty: 30 | Fill #0

## 2018-12-22 DIAGNOSIS — R82998 Other abnormal findings in urine: Secondary | ICD-10-CM | POA: Diagnosis not present

## 2018-12-25 MED FILL — FLUCONAZOLE 150 MG TABLET: 150 | 3 days supply | Qty: 3 | Fill #0

## 2018-12-29 ENCOUNTER — Ambulatory Visit
Admission: RE | Admit: 2018-12-29 | Discharge: 2018-12-29 | Disposition: A | Discharge status: Home / Self Care | Payer: 59 | Source: Ambulatory Visit | Attending: Obstetrics & Gynecology | Admitting: Obstetrics & Gynecology

## 2018-12-29 ENCOUNTER — Other Ambulatory Visit: Payer: Self-pay

## 2018-12-29 DIAGNOSIS — Z1231 Encounter for screening mammogram for malignant neoplasm of breast: Secondary | ICD-10-CM

## 2018-12-31 DIAGNOSIS — Z23 Encounter for immunization: Secondary | ICD-10-CM | POA: Diagnosis not present

## 2019-01-05 DIAGNOSIS — Z20828 Contact with and (suspected) exposure to other viral communicable diseases: Secondary | ICD-10-CM | POA: Diagnosis not present

## 2019-01-08 DIAGNOSIS — R198 Other specified symptoms and signs involving the digestive system and abdomen: Secondary | ICD-10-CM | POA: Diagnosis not present

## 2019-01-08 DIAGNOSIS — R143 Flatulence: Secondary | ICD-10-CM | POA: Diagnosis not present

## 2019-01-08 DIAGNOSIS — K591 Functional diarrhea: Secondary | ICD-10-CM | POA: Diagnosis not present

## 2019-01-27 MED FILL — SYNTHROID 112 MCG TABLET: 112 | 90 days supply | Qty: 90 | Fill #1

## 2019-02-01 ENCOUNTER — Ambulatory Visit (INDEPENDENT_AMBULATORY_CARE_PROVIDER_SITE_OTHER): Payer: 59 | Admitting: Family Medicine

## 2019-02-01 ENCOUNTER — Other Ambulatory Visit: Payer: Self-pay

## 2019-02-01 VITALS — BP 86/54 | Ht 62.0 in | Wt 100.0 lb

## 2019-02-01 DIAGNOSIS — M25521 Pain in right elbow: Secondary | ICD-10-CM | POA: Diagnosis not present

## 2019-02-01 MED ORDER — NITROGLYCERIN 0.2 MG/HR TD PT24: 0.2000 mg | MEDICATED_PATCH | Freq: Every day | TRANSDERMAL | 1 refills | Status: DC | PRN

## 2019-02-01 MED FILL — NITROGLYCERIN 0.2 MG/HR PTC: 0.2 | 28 days supply | Qty: 7 | Fill #0

## 2019-02-01 NOTE — Progress Notes (Signed)
    Subjective:  Tiffany Park is a 58 y.o. female who presents to the [facility] today with a chief complaint of medication refill of nitro patch for right arm tendinitis.   HPI: Patient with recurring history of intermittent right arm tendinitis " tennis elbow" She plays tennis almost daily.  She said that she has been using as needed quarter patch nitro patches for over a year and finds that this greatly improves her functionality.  She says that she will occasionally get a minor headache but has had no dizziness or other negative impacts.  She played tennis this morning and everything was fine.  She has no functional limitations of the elbow.  She said that her pharmacy told her she was out and would like a refill of the medication her last refill was for 30 patches.  No skin changes, swelling.  Objective:  Physical Exam: BP (!) 86/54   Ht 5\' 2"  (1.575 m)   Wt 100 lb (45.4 kg)   BMI 18.29 kg/m   Gen: NAD, pleasant and conversing comfortably, athletic Pulm: NWOB, no cough Skin: warm, dry Neuro: grossly normal, moves all extremities Psych: Normal affect and thought content  Right elbow: No deformity, swelling. FROM with 5/5 strength without pain on wrist or 3rd digit extension. No tenderness to palpation. NVI distally. Collateral ligaments intact.   Assessment/Plan:  Right elbow pain - 2/2 lateral epicondylitis.  Reviewed home exercises again.  Refilled her nitro patches.  F/u with [place] as needed.  Sherene Sires, DO FAMILY MEDICINE RESIDENT - PGY3 02/01/2019 3:17 PM

## 2019-02-02 ENCOUNTER — Encounter: Payer: Self-pay | Admitting: Family Medicine

## 2019-03-02 ENCOUNTER — Encounter: Payer: Self-pay | Admitting: Sports Medicine

## 2019-03-02 ENCOUNTER — Ambulatory Visit (INDEPENDENT_AMBULATORY_CARE_PROVIDER_SITE_OTHER): Payer: 59 | Admitting: Sports Medicine

## 2019-03-02 ENCOUNTER — Other Ambulatory Visit: Payer: Self-pay

## 2019-03-02 ENCOUNTER — Ambulatory Visit: Payer: Self-pay

## 2019-03-02 VITALS — BP 102/78 | Ht 62.0 in | Wt 100.0 lb

## 2019-03-02 DIAGNOSIS — S76302A Unspecified injury of muscle, fascia and tendon of the posterior muscle group at thigh level, left thigh, initial encounter: Secondary | ICD-10-CM

## 2019-03-02 NOTE — Progress Notes (Signed)
   [facility] [address] [place], [place] [address] Phone: [phone] Fax: [phone]   Patient Name: Tiffany Park Date of Birth: Aug 06, 1960 Medical Record Number: [id number] Gender: female Date of Encounter: 03/02/2019  SUBJECTIVE:      Chief Complaint:  Left hamstring pain   HPI:  Patient is 59 yo F running with 3 weeks of acute on chronic left hamstring pain.  She first noticed it with walking uphill then reaggravated it hiking this weekend.  Alleviating factors include stretching, heat, and rest.  She is also complaining of inner thigh burning.  As a reminder, she had a traumatic avulsion of the hamstring about 16 years ago and only has 1 attachment left.  She has been playing tennis over the 3 weeks, but has slowed down in her other physical activity.  She denies any numbness or tingling into her foot.  She denies any pain at her ischial tuberosity.  She feels the bulge in her hamstring is slightly larger than normal.     ROS:     See HPI.   PERTINENT  PMH / PSH / FH / SH:  Past Medical, Surgical, Social, and Family History Reviewed & Updated in the EMR. Pertinent findings include:  See above   OBJECTIVE:  BP 102/78   Ht 5\' 2"  (1.575 m)   Wt 100 lb (45.4 kg)   BMI 18.29 kg/m  Physical Exam:  Vital signs are reviewed.   GEN: Alert and oriented, NAD Pulm: Breathing unlabored PSY: normal mood, congruent affect  MSK: Left hamstring Noticeable bulge in middle of posterior thigh without erythema or bruising Full ROM at hip and knee Mild TTP at area of avulsion bulge Weakness in hamstring testing Full strength in adductor muscle NVI  Right hamstring Normal to inspection Full strength and range of motion at hip and knee NVI  MSK left Limited hamstring ultrasound performed,  Hamstring musculature was scanned in long and short axis.   There is hypoechoic changes at mid substance of hamstring muscle This is at the reattachment  of an avulsed and retracted semitendinosis and biceps femoris.   There is acute hypoechoic change at proximal border of chronic muscle tear. In the muscle mass that is avulsed there are hyperechoic changes and disoreintation of fibers There is also hypoechoic change suggestive of swelling deep to this Semimembranosus still intact   IMPRESSION:  Grade 2 hamstring acute injury at the insertion of an avulsed biceps and semitendinosus muscle groups that is achronic grade 3 injury with secondary healing and scarring.  Ultrasound and interpretation by Dr. and Ruta Hinds. Fields, MD    ASSESSMENT & PLAN:   1. Grade 2 left hamstring acute on chronic strain  Continue to wear compression sleeve when active and on feet for extended periods of time.  You were given handout for hamstring and quadriceps exercises to perform as instructed.  Start nitroglycerin patches per the protocol provided.  We recommend against dry needling until hematoma is resolved.  Limit activity to tolerance but avoid aggressive activity  Sibyl Parr, DO, ATC Sports Medicine Fellow  I observed and examined the patient with Dr. Judge Stall and agree with assessment and plan.  Note reviewed and modified by me.  I spent 35 minutes with this patient. Over 50% of visit was spend in counseling and coordination of care for problems with new hamstring injury.  Ruta Hinds, MD

## 2019-03-02 NOTE — Patient Instructions (Signed)

## 2019-03-08 MED FILL — NITROGLYCERIN 0.2 MG/HR PTC: 0.2 | 28 days supply | Qty: 7 | Fill #1

## 2019-03-16 MED FILL — POTASSIUM CHLORIDE CRYS ER: 10 | 30 days supply | Qty: 15 | Fill #0

## 2019-03-16 MED FILL — CHLORTHALIDONE 25 MG TABS: 25 | 30 days supply | Qty: 15 | Fill #0

## 2019-03-17 DIAGNOSIS — L850 Acquired ichthyosis: Secondary | ICD-10-CM | POA: Diagnosis not present

## 2019-03-17 DIAGNOSIS — L84 Corns and callosities: Secondary | ICD-10-CM | POA: Diagnosis not present

## 2019-03-17 DIAGNOSIS — B078 Other viral warts: Secondary | ICD-10-CM | POA: Diagnosis not present

## 2019-04-01 DIAGNOSIS — Z0000 Encounter for general adult medical examination without abnormal findings: Secondary | ICD-10-CM | POA: Diagnosis not present

## 2019-04-01 DIAGNOSIS — E7849 Other hyperlipidemia: Secondary | ICD-10-CM | POA: Diagnosis not present

## 2019-04-01 DIAGNOSIS — E039 Hypothyroidism, unspecified: Secondary | ICD-10-CM | POA: Diagnosis not present

## 2019-04-06 ENCOUNTER — Ambulatory Visit: Payer: 59 | Admitting: Sports Medicine

## 2019-04-08 DIAGNOSIS — N393 Stress incontinence (female) (male): Secondary | ICD-10-CM | POA: Diagnosis not present

## 2019-04-08 DIAGNOSIS — H04129 Dry eye syndrome of unspecified lacrimal gland: Secondary | ICD-10-CM | POA: Diagnosis not present

## 2019-04-08 DIAGNOSIS — R911 Solitary pulmonary nodule: Secondary | ICD-10-CM | POA: Diagnosis not present

## 2019-04-08 DIAGNOSIS — I700 Atherosclerosis of aorta: Secondary | ICD-10-CM | POA: Diagnosis not present

## 2019-04-08 DIAGNOSIS — Z0000 Encounter for general adult medical examination without abnormal findings: Secondary | ICD-10-CM | POA: Diagnosis not present

## 2019-04-08 DIAGNOSIS — R197 Diarrhea, unspecified: Secondary | ICD-10-CM | POA: Diagnosis not present

## 2019-04-08 DIAGNOSIS — H8109 Meniere's disease, unspecified ear: Secondary | ICD-10-CM | POA: Diagnosis not present

## 2019-04-08 DIAGNOSIS — M76899 Other specified enthesopathies of unspecified lower limb, excluding foot: Secondary | ICD-10-CM | POA: Diagnosis not present

## 2019-04-08 DIAGNOSIS — E785 Hyperlipidemia, unspecified: Secondary | ICD-10-CM | POA: Diagnosis not present

## 2019-04-08 DIAGNOSIS — Z1331 Encounter for screening for depression: Secondary | ICD-10-CM | POA: Diagnosis not present

## 2019-04-12 ENCOUNTER — Ambulatory Visit: Payer: 59 | Attending: Internal Medicine

## 2019-04-12 DIAGNOSIS — Z23 Encounter for immunization: Secondary | ICD-10-CM | POA: Insufficient documentation

## 2019-04-12 NOTE — Progress Notes (Signed)
   Covid-19 Vaccination Clinic  Name:  Tiffany Park    MRN: [id number] DOB: 19-Aug-1960  04/12/2019  [name] was observed post Covid-19 immunization for 15 minutes without incidence. She was provided with Vaccine Information Sheet and instruction to access the V-Safe system.   [name] was instructed to call 911 with any severe reactions post vaccine: Marland Kitchen Difficulty breathing  . Swelling of your face and throat  . A fast heartbeat  . A bad rash all over your body  . Dizziness and weakness    Immunizations Administered    Name Date Dose VIS Date Route   Pfizer COVID-19 Vaccine 04/12/2019  6:11 PM 0.3 mL 02/05/2019 Intramuscular   Manufacturer: [employer], [employer]   Lot: [id number]   NDC: [address]-8

## 2019-04-14 DIAGNOSIS — R82998 Other abnormal findings in urine: Secondary | ICD-10-CM | POA: Diagnosis not present

## 2019-04-19 MED FILL — AMOXICILLIN 500 MG CAPSULE: 500 | 7 days supply | Qty: 21 | Fill #0

## 2019-04-29 MED FILL — SYNTHROID 112 MCG TABLET: 112 | 90 days supply | Qty: 90 | Fill #0

## 2019-05-03 DIAGNOSIS — Z681 Body mass index (BMI) 19 or less, adult: Secondary | ICD-10-CM | POA: Diagnosis not present

## 2019-05-03 DIAGNOSIS — Z01419 Encounter for gynecological examination (general) (routine) without abnormal findings: Secondary | ICD-10-CM | POA: Diagnosis not present

## 2019-05-04 ENCOUNTER — Ambulatory Visit: Payer: 59 | Attending: Internal Medicine

## 2019-05-04 DIAGNOSIS — Z23 Encounter for immunization: Secondary | ICD-10-CM | POA: Insufficient documentation

## 2019-05-04 MED FILL — FLUCONAZOLE 150 MG TABS: 150 | 6 days supply | Qty: 3 | Fill #0

## 2019-05-04 NOTE — Progress Notes (Signed)
   Covid-19 Vaccination Clinic  Name:  Tiffany Park    MRN: [id number] DOB: 1960-09-01  05/04/2019  [name] was observed post Covid-19 immunization for 15 minutes without incident. She was provided with Vaccine Information Sheet and instruction to access the V-Safe system.   [name] was instructed to call 911 with any severe reactions post vaccine: Marland Kitchen Difficulty breathing  . Swelling of face and throat  . A fast heartbeat  . A bad rash all over body  . Dizziness and weakness   Immunizations Administered    Name Date Dose VIS Date Route   Pfizer COVID-19 Vaccine 05/04/2019  8:14 AM 0.3 mL 02/05/2019 Intramuscular   Manufacturer: [employer], [employer]   Lot: [id number]   NDC: [address]-5

## 2019-05-05 ENCOUNTER — Ambulatory Visit: Payer: 59

## 2019-05-12 DIAGNOSIS — H5213 Myopia, bilateral: Secondary | ICD-10-CM | POA: Diagnosis not present

## 2019-06-30 MED FILL — NITROGLYCERIN 0.2 MG/HR PTC: 0.2 | 28 days supply | Qty: 7 | Fill #2

## 2019-06-30 MED FILL — NULYTELY LEMON-LI SOL: 2 days supply | Qty: 4000 | Fill #0

## 2019-07-02 DIAGNOSIS — Z1159 Encounter for screening for other viral diseases: Secondary | ICD-10-CM | POA: Diagnosis not present

## 2019-07-07 DIAGNOSIS — Z1211 Encounter for screening for malignant neoplasm of colon: Secondary | ICD-10-CM | POA: Diagnosis not present

## 2019-07-21 IMAGING — CT CT HEART SCORING
3 series · 12 of 20 positions shown, 14 images · non-contrast
Comparison: None.

CLINICAL DATA: Hyperlipidemia.  57-year-old Caucasian female

EXAM:
CT HEART FOR CALCIUM SCORING
TECHNIQUE: CT heart was performed on a 256 channel system using prospective ECG
gating. A scout and noncontrast exam (for calcium scoring) were
performed. Note that this exam targets the heart and the chest was
not imaged in its entirety.

[Series 2: calcium scoring 2.00 qr36 bestdiast 69% · axial · 0.33mm/px · z∈[+1501,+1533]mm · 2 of 80 slices shown]
[im 16/80  vessel]
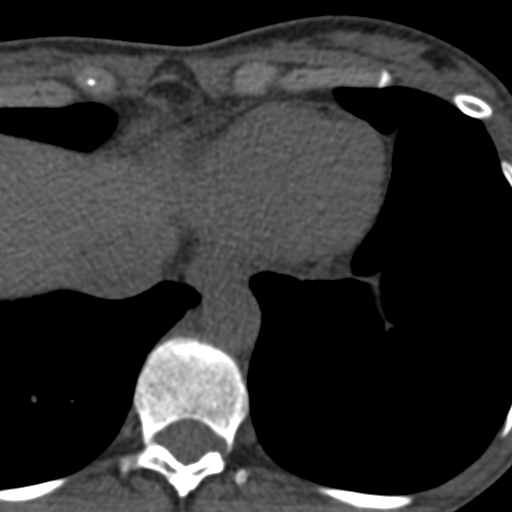
[im 32/80  vessel]
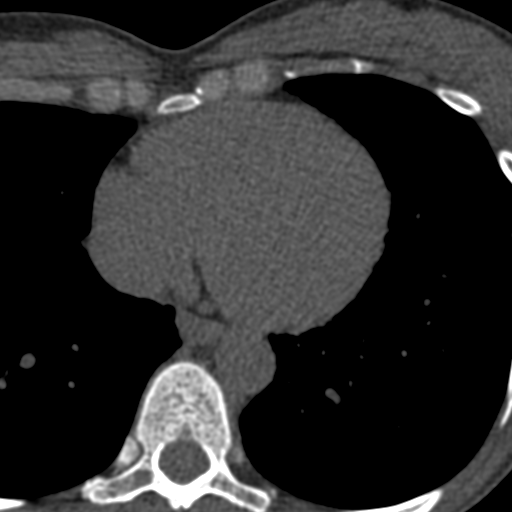

[Series 3: calcium scoring 2.00 br40 bestdiast 69% ax fov · axial · 0.47mm/px · z∈[+1497,+1601]mm · 5 of 80 slices shown, 7 images]
[im 14/80  vessel]
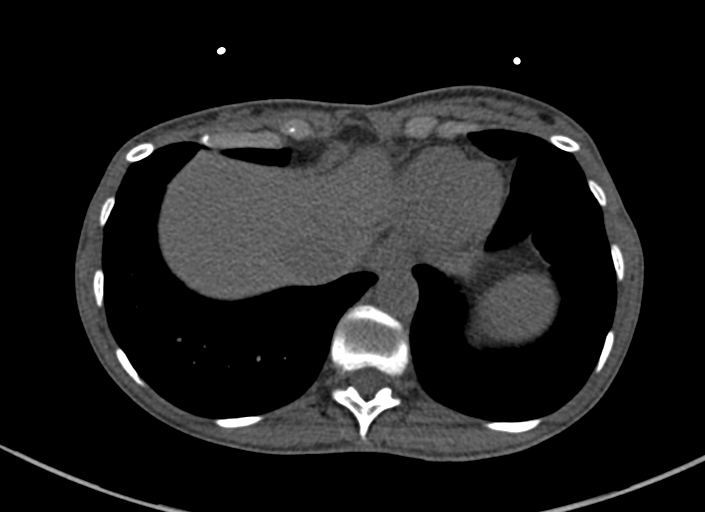
[im 14/80  lung]
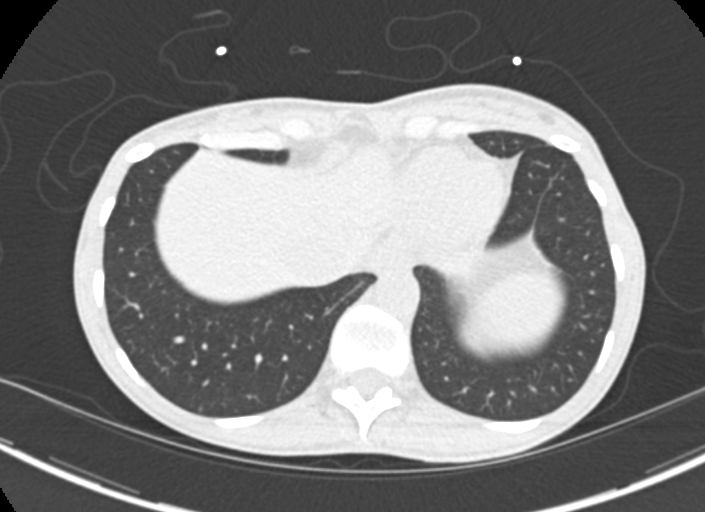
[im 27/80  vessel]
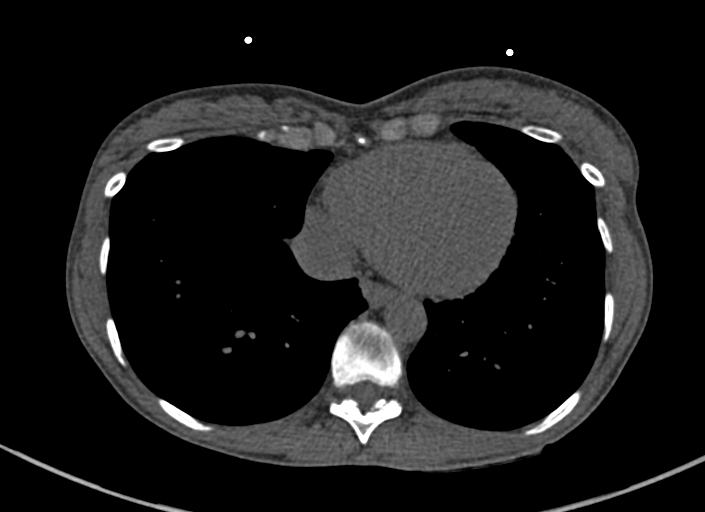
[im 40/80  vessel]
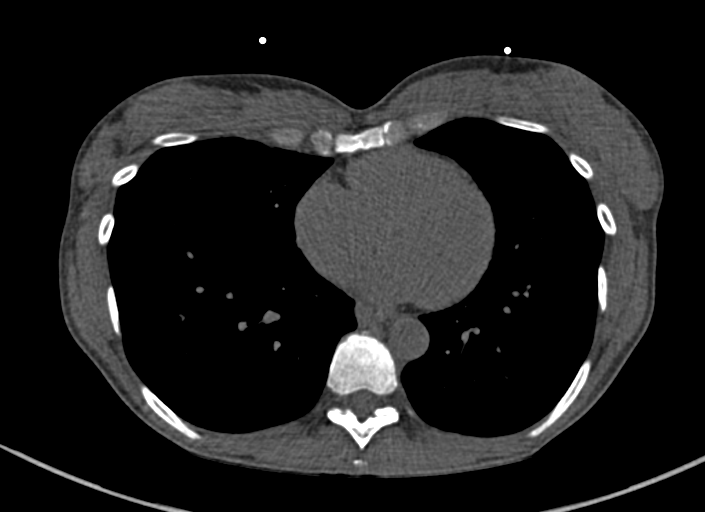
[im 53/80  vessel]
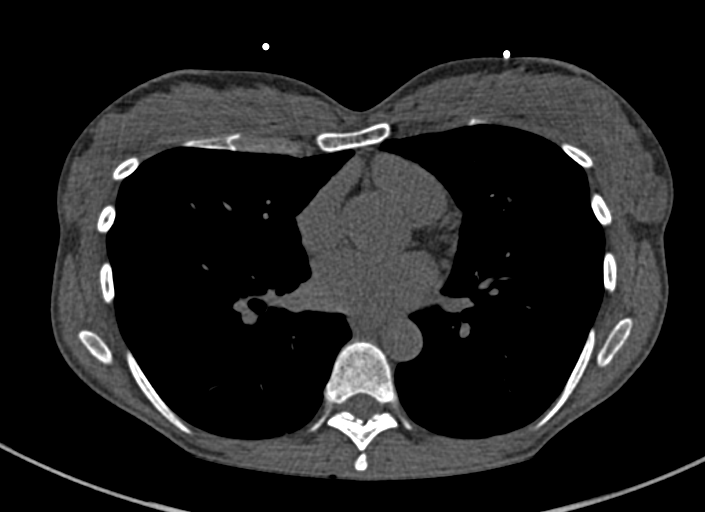
[im 66/80  vessel]
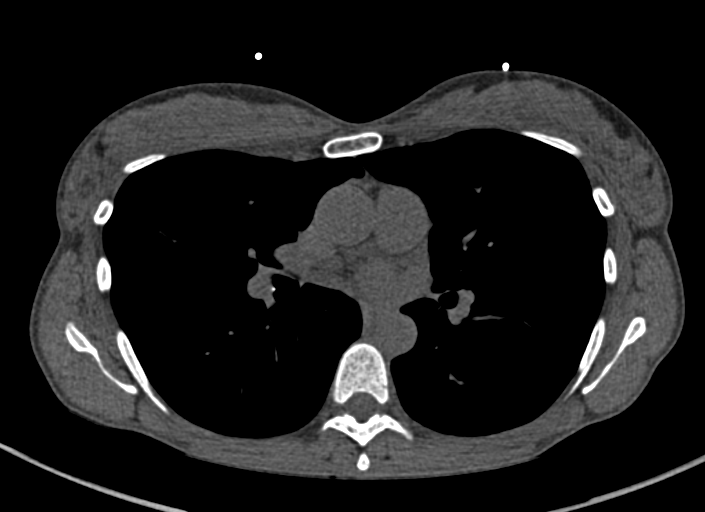
[im 66/80  lung]
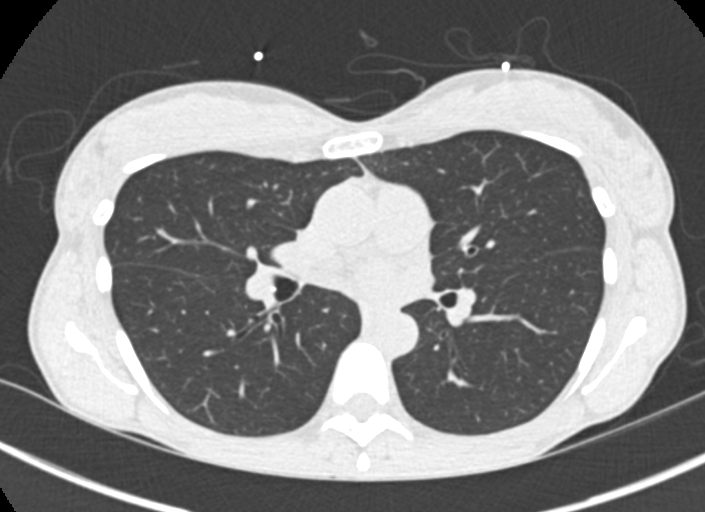

[Series 9: calcium scoring 2.00 br60 bestdiast 69% ax fov · axial · 0.47mm/px · z∈[+1497,+1601]mm · 5 of 80 slices shown]
[im 14/80  vessel]
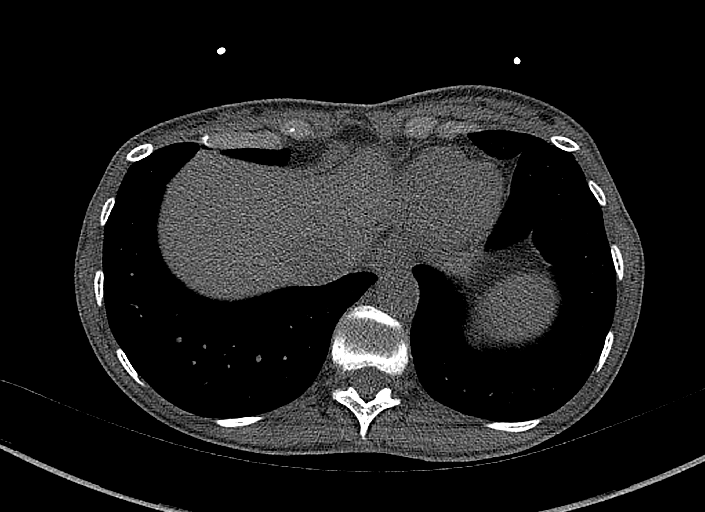
[im 27/80  vessel]
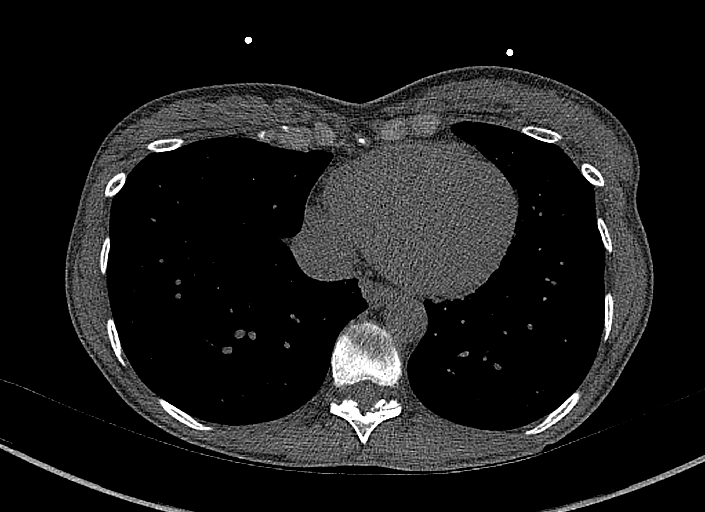
[im 40/80  vessel]
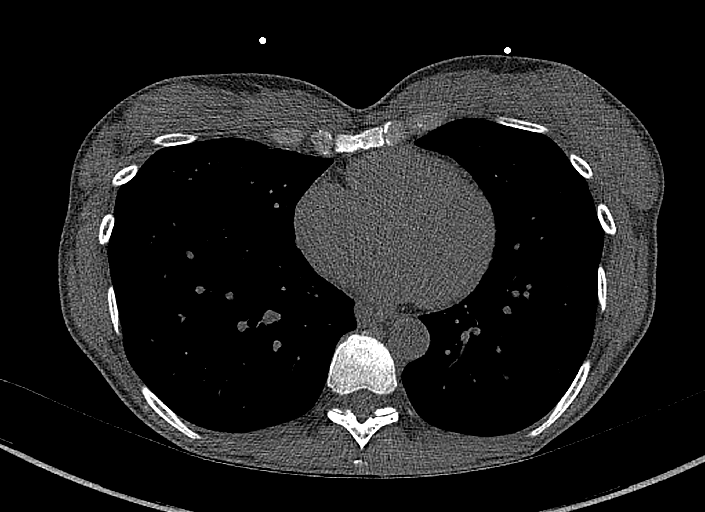
[im 53/80  vessel]
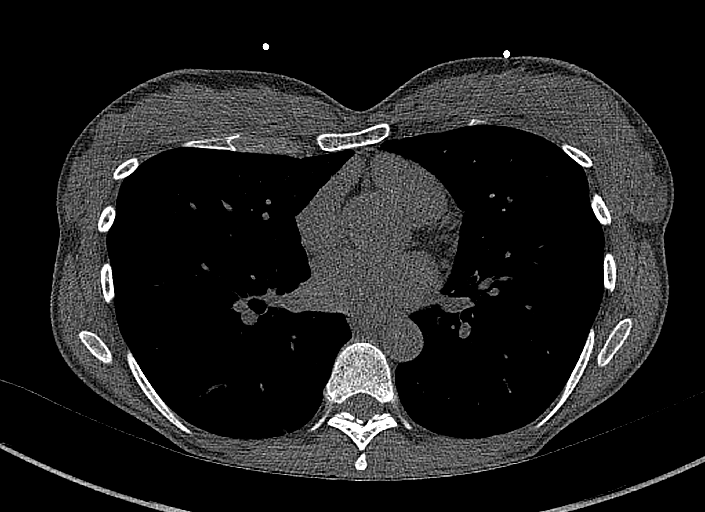
[im 66/80  vessel]
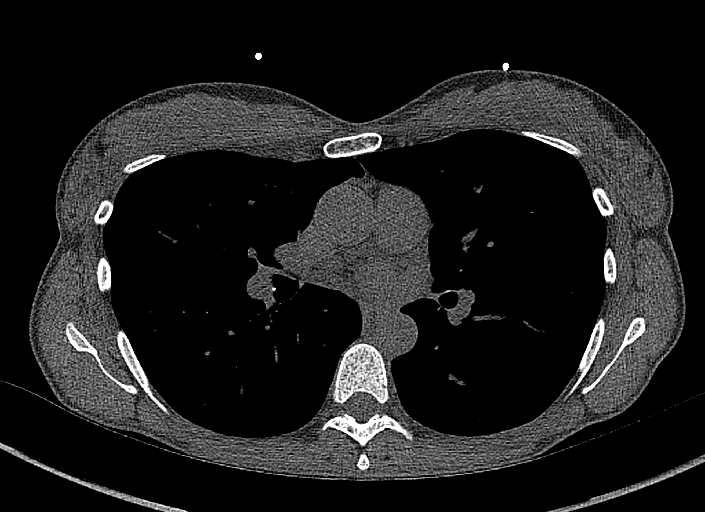

[12 of 20 positions shown; findings below may reference images not displayed]

FINDINGS: Technical quality: Good

No coronary artery calcification.

CORONARY CALCIUM

Total Agatston Score: 0

[HOSPITAL] percentile: 0 th

Ascending aorta ( <  40 mm): 27 mm

EXTRACARDIAC FINDINGS:

Limited view of the lung parenchyma demonstrates a lobular
noncalcified nodule measuring 7 mm in the RIGHT lower lobe (image
62/9). A subpleural nodule measuring 5 mm in the posterior RIGHT
lower lobe on image [DATE].. Airways are normal.

Limited view of the mediastinum demonstrates no adenopathy.
Esophagus normal.

Limited view of the upper abdomen unremarkable.

Limited view of the skeleton and chest wall is unremarkable.
IMPRESSION: 1. No coronary artery calcification.

2. Total Agatston Score: 0

3. MESA age and sex matched database percentile: 0th.

4. Lobular noncalcified RIGHT lower lobe pulmonary nodule.
Non-contrast chest CT at 6-12 months is recommended. If the nodule
is stable at time of repeat CT, then future CT at 18-24 months (from
today's scan) is considered optional for low-risk patients, but is
recommended for high-risk patients. This recommendation follows the
consensus statement: Guidelines for Management of Incidental
Pulmonary Nodules Detected on CT Images: From the [HOSPITAL]

These results will be called to the ordering clinician or
representative by the Radiologist Assistant, and communication
documented in the PACS or zVision Dashboard.

## 2019-07-27 MED FILL — SYNTHROID 112 MCG TABLET: 112 | 90 days supply | Qty: 90 | Fill #1

## 2019-07-27 MED FILL — OLOPATADINE HCL 0.2 % SOLN: 0.2 | 25 days supply | Qty: 3 | Fill #0

## 2019-07-28 MED FILL — FLUOROMETHOLONE 0.1% DROPS: 0.1 | 25 days supply | Qty: 5 | Fill #0

## 2019-09-10 MED FILL — MELOXICAM 15 MG TABLET: 15 | 15 days supply | Qty: 15 | Fill #0

## 2019-09-13 MED FILL — PROGESTERONE 100 MG CAPSULE: 100 | 90 days supply | Qty: 90 | Fill #0

## 2019-10-05 DIAGNOSIS — H9193 Unspecified hearing loss, bilateral: Secondary | ICD-10-CM | POA: Diagnosis not present

## 2019-10-05 DIAGNOSIS — H6121 Impacted cerumen, right ear: Secondary | ICD-10-CM | POA: Diagnosis not present

## 2019-10-05 DIAGNOSIS — H6122 Impacted cerumen, left ear: Secondary | ICD-10-CM | POA: Diagnosis not present

## 2019-10-22 MED FILL — NITROGLYCERIN 0.2 MG/HR PTC: 0.2 | 28 days supply | Qty: 7 | Fill #3

## 2019-11-03 MED FILL — SYNTHROID 112 MCG TABLET: 112 | 90 days supply | Qty: 90 | Fill #2

## 2019-11-22 DIAGNOSIS — H6981 Other specified disorders of Eustachian tube, right ear: Secondary | ICD-10-CM | POA: Diagnosis not present

## 2019-11-22 DIAGNOSIS — Z8669 Personal history of other diseases of the nervous system and sense organs: Secondary | ICD-10-CM | POA: Diagnosis not present

## 2019-11-22 DIAGNOSIS — J3489 Other specified disorders of nose and nasal sinuses: Secondary | ICD-10-CM | POA: Diagnosis not present

## 2019-11-22 DIAGNOSIS — H6121 Impacted cerumen, right ear: Secondary | ICD-10-CM | POA: Diagnosis not present

## 2019-12-06 ENCOUNTER — Other Ambulatory Visit (HOSPITAL_COMMUNITY): Payer: Self-pay | Admitting: Internal Medicine

## 2019-12-06 MED FILL — MELOXICAM 15 MG TABLET: 15 | 15 days supply | Qty: 15 | Fill #0

## 2019-12-28 DIAGNOSIS — H02055 Trichiasis without entropian left lower eyelid: Secondary | ICD-10-CM | POA: Diagnosis not present

## 2020-02-04 MED FILL — FLUCONAZOLE 150 MG TABS: 150 | 6 days supply | Qty: 3 | Fill #1

## 2020-02-04 MED FILL — SYNTHROID 112 MCG TABLET: 112 | 90 days supply | Qty: 90 | Fill #3

## 2020-03-02 ENCOUNTER — Other Ambulatory Visit (HOSPITAL_COMMUNITY): Payer: Self-pay | Admitting: Oral and Maxillofacial Surgery

## 2020-03-02 MED FILL — CLINDAMYCIN HCL 150 MG CAPS: 150 | 1 days supply | Qty: 4 | Fill #0

## 2020-03-03 ENCOUNTER — Other Ambulatory Visit (HOSPITAL_COMMUNITY): Payer: Self-pay | Admitting: Oral and Maxillofacial Surgery

## 2020-03-03 MED FILL — AMOXICILLIN 500 MG CAPSULE: 500 | 5 days supply | Qty: 15 | Fill #0

## 2020-03-27 DIAGNOSIS — Z20822 Contact with and (suspected) exposure to covid-19: Secondary | ICD-10-CM | POA: Diagnosis not present

## 2020-03-27 DIAGNOSIS — Z03818 Encounter for observation for suspected exposure to other biological agents ruled out: Secondary | ICD-10-CM | POA: Diagnosis not present

## 2020-03-28 DIAGNOSIS — Z20822 Contact with and (suspected) exposure to covid-19: Secondary | ICD-10-CM | POA: Diagnosis not present

## 2020-04-26 ENCOUNTER — Other Ambulatory Visit (HOSPITAL_COMMUNITY): Payer: Self-pay | Admitting: Internal Medicine

## 2020-04-26 MED FILL — SYNTHROID 112 MCG TABLET: 112 | 30 days supply | Qty: 30 | Fill #0

## 2020-05-15 DIAGNOSIS — E039 Hypothyroidism, unspecified: Secondary | ICD-10-CM | POA: Diagnosis not present

## 2020-05-15 DIAGNOSIS — E785 Hyperlipidemia, unspecified: Secondary | ICD-10-CM | POA: Diagnosis not present

## 2020-05-15 DIAGNOSIS — Z0000 Encounter for general adult medical examination without abnormal findings: Secondary | ICD-10-CM | POA: Diagnosis not present

## 2020-05-18 DIAGNOSIS — Z01419 Encounter for gynecological examination (general) (routine) without abnormal findings: Secondary | ICD-10-CM | POA: Diagnosis not present

## 2020-05-18 DIAGNOSIS — Z13820 Encounter for screening for osteoporosis: Secondary | ICD-10-CM | POA: Diagnosis not present

## 2020-05-18 DIAGNOSIS — Z681 Body mass index (BMI) 19 or less, adult: Secondary | ICD-10-CM | POA: Diagnosis not present

## 2020-05-19 DIAGNOSIS — R82998 Other abnormal findings in urine: Secondary | ICD-10-CM | POA: Diagnosis not present

## 2020-05-19 DIAGNOSIS — E039 Hypothyroidism, unspecified: Secondary | ICD-10-CM | POA: Diagnosis not present

## 2020-05-19 DIAGNOSIS — Z0000 Encounter for general adult medical examination without abnormal findings: Secondary | ICD-10-CM | POA: Diagnosis not present

## 2020-05-24 ENCOUNTER — Other Ambulatory Visit (HOSPITAL_COMMUNITY): Payer: Self-pay | Admitting: Internal Medicine

## 2020-05-25 DIAGNOSIS — Z1212 Encounter for screening for malignant neoplasm of rectum: Secondary | ICD-10-CM | POA: Diagnosis not present

## 2020-05-25 MED FILL — SYNTHROID 112 MCG TABLET: 112 | 90 days supply | Qty: 90 | Fill #0

## 2020-07-14 ENCOUNTER — Other Ambulatory Visit (HOSPITAL_COMMUNITY): Payer: Self-pay

## 2020-07-14 MED ORDER — DIAZEPAM 5 MG PO TABS
5.0000 mg | ORAL_TABLET | Freq: Every day | ORAL | 0 refills | Status: DC | PRN
  Filled 2020-07-14: qty 30, 30d supply, fill #0

## 2020-07-14 MED ORDER — MELOXICAM 15 MG PO TABS
7.5000 mg | ORAL_TABLET | Freq: Every day | ORAL | 0 refills | Status: AC
  Filled 2020-07-14: qty 15, 15d supply, fill #0

## 2020-07-19 ENCOUNTER — Other Ambulatory Visit (HOSPITAL_COMMUNITY): Payer: Self-pay

## 2020-08-18 ENCOUNTER — Other Ambulatory Visit (HOSPITAL_COMMUNITY): Payer: Self-pay

## 2020-08-18 MED FILL — Levothyroxine Sodium Tab 112 MCG: ORAL | 90 days supply | Qty: 90 | Fill #0 | Status: AC

## 2020-08-21 ENCOUNTER — Other Ambulatory Visit (HOSPITAL_COMMUNITY): Payer: Self-pay

## 2020-09-28 DIAGNOSIS — H6981 Other specified disorders of Eustachian tube, right ear: Secondary | ICD-10-CM | POA: Diagnosis not present

## 2020-09-28 DIAGNOSIS — H6123 Impacted cerumen, bilateral: Secondary | ICD-10-CM | POA: Diagnosis not present

## 2020-09-28 DIAGNOSIS — J3489 Other specified disorders of nose and nasal sinuses: Secondary | ICD-10-CM | POA: Diagnosis not present

## 2020-09-28 DIAGNOSIS — H938X2 Other specified disorders of left ear: Secondary | ICD-10-CM | POA: Diagnosis not present

## 2020-09-29 DIAGNOSIS — H903 Sensorineural hearing loss, bilateral: Secondary | ICD-10-CM | POA: Diagnosis not present

## 2020-10-13 ENCOUNTER — Other Ambulatory Visit (HOSPITAL_COMMUNITY): Payer: Self-pay

## 2020-10-13 DIAGNOSIS — H938X3 Other specified disorders of ear, bilateral: Secondary | ICD-10-CM | POA: Diagnosis not present

## 2020-10-13 DIAGNOSIS — J3489 Other specified disorders of nose and nasal sinuses: Secondary | ICD-10-CM | POA: Diagnosis not present

## 2020-10-13 DIAGNOSIS — H8109 Meniere's disease, unspecified ear: Secondary | ICD-10-CM | POA: Diagnosis not present

## 2020-10-13 MED ORDER — TRIAMTERENE-HCTZ 37.5-25 MG PO CAPS
1.0000 | ORAL_CAPSULE | Freq: Every day | ORAL | 3 refills | Status: AC
  Filled 2020-10-13: qty 30, 30d supply, fill #0

## 2020-10-26 ENCOUNTER — Other Ambulatory Visit (HOSPITAL_COMMUNITY): Payer: Self-pay

## 2020-10-26 ENCOUNTER — Other Ambulatory Visit: Payer: Self-pay

## 2020-10-26 MED ORDER — NITROGLYCERIN 0.1 MG/HR TD PT24
0.1000 mg | MEDICATED_PATCH | Freq: Two times a day (BID) | TRANSDERMAL | 0 refills | Status: AC
  Filled 2020-10-26: qty 30, 15d supply, fill #0

## 2020-10-27 ENCOUNTER — Other Ambulatory Visit (HOSPITAL_COMMUNITY): Payer: Self-pay

## 2020-10-31 ENCOUNTER — Other Ambulatory Visit (HOSPITAL_COMMUNITY): Payer: Self-pay

## 2020-10-31 ENCOUNTER — Other Ambulatory Visit: Payer: Self-pay

## 2020-11-01 ENCOUNTER — Other Ambulatory Visit (HOSPITAL_COMMUNITY): Payer: Self-pay

## 2020-11-01 ENCOUNTER — Other Ambulatory Visit: Payer: Self-pay

## 2020-11-02 ENCOUNTER — Other Ambulatory Visit (HOSPITAL_COMMUNITY): Payer: Self-pay

## 2020-11-02 ENCOUNTER — Other Ambulatory Visit: Payer: Self-pay

## 2020-11-03 ENCOUNTER — Other Ambulatory Visit (HOSPITAL_COMMUNITY): Payer: Self-pay

## 2020-11-06 ENCOUNTER — Other Ambulatory Visit (HOSPITAL_COMMUNITY): Payer: Self-pay

## 2020-11-07 ENCOUNTER — Other Ambulatory Visit (HOSPITAL_COMMUNITY): Payer: Self-pay

## 2020-12-03 MED FILL — Levothyroxine Sodium Tab 112 MCG: ORAL | 90 days supply | Qty: 90 | Fill #1 | Status: AC

## 2020-12-04 ENCOUNTER — Other Ambulatory Visit (HOSPITAL_COMMUNITY): Payer: Self-pay

## 2021-01-29 DIAGNOSIS — B078 Other viral warts: Secondary | ICD-10-CM | POA: Diagnosis not present

## 2021-03-03 MED FILL — Levothyroxine Sodium Tab 112 MCG: ORAL | 90 days supply | Qty: 90 | Fill #2 | Status: AC

## 2021-03-05 ENCOUNTER — Other Ambulatory Visit (HOSPITAL_COMMUNITY): Payer: Self-pay

## 2021-03-09 ENCOUNTER — Other Ambulatory Visit (HOSPITAL_COMMUNITY): Payer: Self-pay

## 2021-03-09 MED ORDER — MELOXICAM 15 MG PO TABS
7.5000 mg | ORAL_TABLET | Freq: Every day | ORAL | 0 refills | Status: DC
  Filled 2021-03-09: qty 15, 15d supply, fill #0

## 2021-03-15 DIAGNOSIS — Z681 Body mass index (BMI) 19 or less, adult: Secondary | ICD-10-CM | POA: Diagnosis not present

## 2021-03-15 DIAGNOSIS — Z885 Allergy status to narcotic agent status: Secondary | ICD-10-CM | POA: Diagnosis not present

## 2021-03-15 DIAGNOSIS — H9120 Sudden idiopathic hearing loss, unspecified ear: Secondary | ICD-10-CM | POA: Diagnosis not present

## 2021-03-15 DIAGNOSIS — H8103 Meniere's disease, bilateral: Secondary | ICD-10-CM | POA: Diagnosis not present

## 2021-03-15 DIAGNOSIS — R42 Dizziness and giddiness: Secondary | ICD-10-CM | POA: Diagnosis not present

## 2021-03-15 DIAGNOSIS — H903 Sensorineural hearing loss, bilateral: Secondary | ICD-10-CM | POA: Diagnosis not present

## 2021-03-29 DIAGNOSIS — B078 Other viral warts: Secondary | ICD-10-CM | POA: Diagnosis not present

## 2021-04-26 ENCOUNTER — Other Ambulatory Visit (HOSPITAL_COMMUNITY): Payer: Self-pay

## 2021-04-26 MED ORDER — ESTRADIOL 0.1 MG/24HR TD PTTW
1.0000 | MEDICATED_PATCH | TRANSDERMAL | 3 refills | Status: DC
  Filled 2021-04-26: qty 24, 84d supply, fill #0

## 2021-05-24 ENCOUNTER — Other Ambulatory Visit (HOSPITAL_COMMUNITY): Payer: Self-pay

## 2021-05-24 DIAGNOSIS — Z681 Body mass index (BMI) 19 or less, adult: Secondary | ICD-10-CM | POA: Diagnosis not present

## 2021-05-24 DIAGNOSIS — Z01419 Encounter for gynecological examination (general) (routine) without abnormal findings: Secondary | ICD-10-CM | POA: Diagnosis not present

## 2021-05-24 DIAGNOSIS — Z124 Encounter for screening for malignant neoplasm of cervix: Secondary | ICD-10-CM | POA: Diagnosis not present

## 2021-05-25 ENCOUNTER — Other Ambulatory Visit (HOSPITAL_COMMUNITY): Payer: Self-pay

## 2021-05-25 MED ORDER — LEVOTHYROXINE SODIUM 112 MCG PO TABS
112.0000 ug | ORAL_TABLET | Freq: Every day | ORAL | 4 refills | Status: DC
  Filled 2021-05-25: qty 90, 90d supply, fill #0
  Filled 2021-08-25: qty 90, 90d supply, fill #1
  Filled 2021-08-27: qty 30, 30d supply, fill #1
  Filled 2021-09-22: qty 90, 90d supply, fill #2

## 2021-05-28 ENCOUNTER — Other Ambulatory Visit (HOSPITAL_COMMUNITY): Payer: Self-pay

## 2021-06-22 ENCOUNTER — Other Ambulatory Visit (HOSPITAL_COMMUNITY): Payer: Self-pay

## 2021-06-22 DIAGNOSIS — R0981 Nasal congestion: Secondary | ICD-10-CM | POA: Diagnosis not present

## 2021-06-22 DIAGNOSIS — H6592 Unspecified nonsuppurative otitis media, left ear: Secondary | ICD-10-CM | POA: Diagnosis not present

## 2021-06-22 MED ORDER — AMOXICILLIN-POT CLAVULANATE 875-125 MG PO TABS
1.0000 | ORAL_TABLET | Freq: Two times a day (BID) | ORAL | 0 refills | Status: DC
  Filled 2021-06-22: qty 20, 10d supply, fill #0

## 2021-07-13 DIAGNOSIS — E785 Hyperlipidemia, unspecified: Secondary | ICD-10-CM | POA: Diagnosis not present

## 2021-07-20 ENCOUNTER — Other Ambulatory Visit (HOSPITAL_COMMUNITY): Payer: Self-pay

## 2021-07-20 DIAGNOSIS — Z23 Encounter for immunization: Secondary | ICD-10-CM | POA: Diagnosis not present

## 2021-07-20 DIAGNOSIS — E039 Hypothyroidism, unspecified: Secondary | ICD-10-CM | POA: Diagnosis not present

## 2021-07-20 DIAGNOSIS — Z1331 Encounter for screening for depression: Secondary | ICD-10-CM | POA: Diagnosis not present

## 2021-07-20 DIAGNOSIS — Z0000 Encounter for general adult medical examination without abnormal findings: Secondary | ICD-10-CM | POA: Diagnosis not present

## 2021-07-20 DIAGNOSIS — R82998 Other abnormal findings in urine: Secondary | ICD-10-CM | POA: Diagnosis not present

## 2021-07-20 MED ORDER — MELOXICAM 15 MG PO TABS
7.5000 mg | ORAL_TABLET | Freq: Every day | ORAL | 0 refills | Status: DC
  Filled 2021-07-20: qty 15, 15d supply, fill #0

## 2021-08-17 ENCOUNTER — Other Ambulatory Visit: Payer: Self-pay | Admitting: Obstetrics & Gynecology

## 2021-08-17 DIAGNOSIS — Z1231 Encounter for screening mammogram for malignant neoplasm of breast: Secondary | ICD-10-CM

## 2021-08-22 ENCOUNTER — Ambulatory Visit
Admission: RE | Admit: 2021-08-22 | Discharge: 2021-08-22 | Disposition: A | Discharge status: Home / Self Care | Payer: BC Managed Care – PPO | Source: Ambulatory Visit | Attending: Obstetrics & Gynecology | Admitting: Obstetrics & Gynecology

## 2021-08-22 DIAGNOSIS — Z1231 Encounter for screening mammogram for malignant neoplasm of breast: Secondary | ICD-10-CM | POA: Diagnosis not present

## 2021-08-25 ENCOUNTER — Other Ambulatory Visit (HOSPITAL_COMMUNITY): Payer: Self-pay

## 2021-08-27 ENCOUNTER — Other Ambulatory Visit (HOSPITAL_COMMUNITY): Payer: Self-pay

## 2021-09-06 DIAGNOSIS — Z0000 Encounter for general adult medical examination without abnormal findings: Secondary | ICD-10-CM | POA: Diagnosis not present

## 2021-09-17 ENCOUNTER — Other Ambulatory Visit (HOSPITAL_COMMUNITY): Payer: Self-pay

## 2021-09-17 MED ORDER — PROGESTERONE MICRONIZED 100 MG PO CAPS
200.0000 mg | ORAL_CAPSULE | Freq: Every day | ORAL | 6 refills | Status: AC
  Filled 2021-09-17: qty 24, 12d supply, fill #0
  Filled 2022-02-13: qty 24, 12d supply, fill #1

## 2021-09-22 ENCOUNTER — Other Ambulatory Visit (HOSPITAL_COMMUNITY): Payer: Self-pay

## 2021-10-02 ENCOUNTER — Ambulatory Visit (INDEPENDENT_AMBULATORY_CARE_PROVIDER_SITE_OTHER): Payer: BC Managed Care – PPO | Admitting: Sports Medicine

## 2021-10-02 VITALS — BP 115/70 | Ht 62.0 in | Wt 100.0 lb

## 2021-10-02 DIAGNOSIS — S86112A Strain of other muscle(s) and tendon(s) of posterior muscle group at lower leg level, left leg, initial encounter: Secondary | ICD-10-CM

## 2021-10-03 DIAGNOSIS — S86112A Strain of other muscle(s) and tendon(s) of posterior muscle group at lower leg level, left leg, initial encounter: Secondary | ICD-10-CM | POA: Insufficient documentation

## 2021-10-03 NOTE — Assessment & Plan Note (Signed)
Clinical and radiographic evidence of grade 2 gastroc strain of the medial head.  Patient is able to ambulate without any pain.  She was given compression sleeve today to be worn with activity.  She was also given a handout and described exercises eccentric calf strengthening.  She may return to tennis after eccentric strengthening is no longer painful.  Follow-up as needed.

## 2021-10-03 NOTE — Progress Notes (Signed)
   Established Patient Office Visit  Subjective   Patient ID: Tiffany Park, female    DOB: [birth year]  Age: 61 y.o. MRN: [id number]  Left calf pain  Ms. Tiffany Park is a 61 year old female who presents today with left calf pain that began approximately 10 days ago when she was playing tennis and running laterally.  She felt immediate pain and tightening in that leg about midway down.  She had difficulty walking later that day.  For the next 2 days she rested and was using ice every 20 minutes except for while sleeping.  Her pain has improved to only a tenderness when palpated.  She began slowly reincorporating her walking regimen into her activities.  That does not give her any pain at this time.  She denies any swelling or bruising at the time of the injury.    Objective:     BP 115/70   Ht 5\' 2"  (1.575 m)   Wt 100 lb (45.4 kg)   BMI 18.29 kg/m    Physical Exam Vitals reviewed.  Constitutional:      General: She is not in acute distress.    Appearance: Normal appearance. She is not ill-appearing, toxic-appearing or diaphoretic.  HENT:     Head: Normocephalic.  Pulmonary:     Effort: Pulmonary effort is normal.  Neurological:     Mental Status: She is alert.   L calf: no obvious deformity or asymmetry.  No ecchymosis or edema.  Tenderness to palpation along musculotendinous junction of the medial gastroc head.  Full range of motion at the ankle and knee flexion, extension.  Strength 5/5 plantarflexion and dorsiflexion, knee flexion and extension.  Negative Thompson's.  Ultrasound evaluation of left calf both medial and lateral gastroc muscle fibers was visualized in long and short axis   Small area of hypoechoic fluid seen in the medial gastroc at the intersection with the fascial fibers of Achilles Doppler was placed with no increase in vessels identified. Impression: Medial head of gastrocnemius strain (Tennis Leg) with findings are indicative of grade 2 strain with some residual  blood pooling.  Ultrasound and interpretation by Dr. and Leonor Liv. Fields, MD      Assessment & Plan:   Problem List Items Addressed This Visit       Musculoskeletal and Integument   Strain of gastrocnemius muscle of left lower extremity - Primary    Clinical and radiographic evidence of grade 2 gastroc strain of the medial head.  Patient is able to ambulate without any pain.  She was given compression sleeve today to be worn with activity.  She was also given a handout and described exercises eccentric calf strengthening.  She may return to tennis after eccentric strengthening is no longer painful.  Follow-up as needed.       Return if symptoms worsen or fail to improve.    Sibyl Parr, DO  I observed and examined the patient with the [facility] resident and agree with assessment and plan.  Note reviewed and modified by me. If any symptoms persist we should repeat the [facility] in 6 weeks. [place], MD

## 2021-10-25 DIAGNOSIS — H04123 Dry eye syndrome of bilateral lacrimal glands: Secondary | ICD-10-CM | POA: Diagnosis not present

## 2021-10-25 DIAGNOSIS — H5213 Myopia, bilateral: Secondary | ICD-10-CM | POA: Diagnosis not present

## 2021-11-29 DIAGNOSIS — H903 Sensorineural hearing loss, bilateral: Secondary | ICD-10-CM | POA: Diagnosis not present

## 2021-12-03 ENCOUNTER — Other Ambulatory Visit (HOSPITAL_COMMUNITY): Payer: Self-pay

## 2021-12-03 MED ORDER — MELOXICAM 15 MG PO TABS
7.5000 mg | ORAL_TABLET | Freq: Every day | ORAL | 0 refills | Status: DC
  Filled 2021-12-03: qty 15, 15d supply, fill #0

## 2021-12-28 ENCOUNTER — Other Ambulatory Visit (HOSPITAL_COMMUNITY): Payer: Self-pay

## 2021-12-28 MED ORDER — LEVOTHYROXINE SODIUM 112 MCG PO TABS
112.0000 ug | ORAL_TABLET | Freq: Every day | ORAL | 3 refills | Status: AC
  Filled 2021-12-28: qty 90, 90d supply, fill #0
  Filled 2022-03-15: qty 90, 90d supply, fill #1
  Filled 2022-06-23: qty 90, 90d supply, fill #2
  Filled 2022-09-19: qty 90, 90d supply, fill #3

## 2022-02-12 ENCOUNTER — Other Ambulatory Visit (HOSPITAL_COMMUNITY): Payer: Self-pay

## 2022-02-13 ENCOUNTER — Other Ambulatory Visit (HOSPITAL_COMMUNITY): Payer: Self-pay

## 2022-02-13 MED ORDER — ESTRADIOL 0.1 MG/24HR TD PTTW
1.0000 | MEDICATED_PATCH | TRANSDERMAL | 0 refills | Status: AC
  Filled 2022-02-13: qty 8, 28d supply, fill #0

## 2022-02-21 ENCOUNTER — Other Ambulatory Visit (HOSPITAL_COMMUNITY): Payer: Self-pay

## 2022-02-21 MED ORDER — MELOXICAM 15 MG PO TABS
7.5000 mg | ORAL_TABLET | Freq: Every day | ORAL | 5 refills | Status: AC
  Filled 2022-02-21: qty 15, 15d supply, fill #0

## 2022-02-26 ENCOUNTER — Other Ambulatory Visit (HOSPITAL_COMMUNITY): Payer: Self-pay

## 2022-03-11 ENCOUNTER — Other Ambulatory Visit (HOSPITAL_COMMUNITY): Payer: Self-pay

## 2022-03-11 MED ORDER — SCOPOLAMINE 1 MG/3DAYS TD PT72
1.0000 | MEDICATED_PATCH | TRANSDERMAL | 0 refills | Status: AC
  Filled 2022-03-11: qty 8, 24d supply, fill #0
  Filled 2022-03-12: qty 4, 12d supply, fill #0

## 2022-03-12 ENCOUNTER — Other Ambulatory Visit (HOSPITAL_COMMUNITY): Payer: Self-pay

## 2022-03-15 ENCOUNTER — Other Ambulatory Visit (HOSPITAL_COMMUNITY): Payer: Self-pay

## 2022-03-15 ENCOUNTER — Other Ambulatory Visit: Payer: Self-pay

## 2022-03-15 MED ORDER — DIAZEPAM 5 MG PO TABS
5.0000 mg | ORAL_TABLET | Freq: Every day | ORAL | 0 refills | Status: AC | PRN
  Filled 2022-03-15: qty 30, 30d supply, fill #0

## 2022-03-19 ENCOUNTER — Other Ambulatory Visit (HOSPITAL_COMMUNITY): Payer: Self-pay

## 2022-03-20 ENCOUNTER — Other Ambulatory Visit (HOSPITAL_COMMUNITY): Payer: Self-pay

## 2022-03-20 MED ORDER — ESTRADIOL 0.1 MG/24HR TD PTTW
1.0000 | MEDICATED_PATCH | TRANSDERMAL | 0 refills | Status: AC
  Filled 2022-03-20: qty 24, 84d supply, fill #0

## 2022-03-21 ENCOUNTER — Other Ambulatory Visit (HOSPITAL_COMMUNITY): Payer: Self-pay

## 2022-03-21 MED ORDER — ESTRADIOL 0.1 MG/24HR TD PTTW
MEDICATED_PATCH | TRANSDERMAL | 0 refills | Status: AC
  Filled 2022-03-21: qty 8, 28d supply, fill #0

## 2022-03-28 ENCOUNTER — Other Ambulatory Visit (HOSPITAL_COMMUNITY): Payer: Self-pay

## 2022-05-28 DIAGNOSIS — Z01419 Encounter for gynecological examination (general) (routine) without abnormal findings: Secondary | ICD-10-CM | POA: Diagnosis not present

## 2022-05-28 DIAGNOSIS — Z1231 Encounter for screening mammogram for malignant neoplasm of breast: Secondary | ICD-10-CM | POA: Diagnosis not present

## 2022-05-28 DIAGNOSIS — Z681 Body mass index (BMI) 19 or less, adult: Secondary | ICD-10-CM | POA: Diagnosis not present

## 2022-05-29 ENCOUNTER — Other Ambulatory Visit (HOSPITAL_COMMUNITY): Payer: Self-pay

## 2022-05-29 MED ORDER — PROGESTERONE MICRONIZED 100 MG PO CAPS
200.0000 mg | ORAL_CAPSULE | Freq: Every day | ORAL | 6 refills | Status: DC
  Filled 2022-05-29: qty 24, 12d supply, fill #0
  Filled 2022-09-19: qty 24, 12d supply, fill #1
  Filled 2023-01-31: qty 24, 12d supply, fill #2
  Filled 2023-05-13: qty 24, 12d supply, fill #3

## 2022-06-24 ENCOUNTER — Other Ambulatory Visit: Payer: Self-pay

## 2022-06-26 ENCOUNTER — Other Ambulatory Visit (HOSPITAL_COMMUNITY): Payer: Self-pay

## 2022-07-23 ENCOUNTER — Other Ambulatory Visit (HOSPITAL_COMMUNITY): Payer: Self-pay

## 2022-07-23 MED ORDER — ESTRADIOL 0.1 MG/24HR TD PTTW
1.0000 | MEDICATED_PATCH | TRANSDERMAL | 3 refills | Status: AC
  Filled 2022-07-23: qty 24, 84d supply, fill #0
  Filled 2023-01-31: qty 8, 28d supply, fill #0
  Filled 2023-01-31: qty 24, 84d supply, fill #0

## 2022-07-30 DIAGNOSIS — R7989 Other specified abnormal findings of blood chemistry: Secondary | ICD-10-CM | POA: Diagnosis not present

## 2022-07-30 DIAGNOSIS — E785 Hyperlipidemia, unspecified: Secondary | ICD-10-CM | POA: Diagnosis not present

## 2022-07-30 DIAGNOSIS — E039 Hypothyroidism, unspecified: Secondary | ICD-10-CM | POA: Diagnosis not present

## 2022-08-06 DIAGNOSIS — Z1212 Encounter for screening for malignant neoplasm of rectum: Secondary | ICD-10-CM | POA: Diagnosis not present

## 2022-08-06 DIAGNOSIS — Z1331 Encounter for screening for depression: Secondary | ICD-10-CM | POA: Diagnosis not present

## 2022-08-06 DIAGNOSIS — Z0000 Encounter for general adult medical examination without abnormal findings: Secondary | ICD-10-CM | POA: Diagnosis not present

## 2022-08-06 DIAGNOSIS — E039 Hypothyroidism, unspecified: Secondary | ICD-10-CM | POA: Diagnosis not present

## 2022-08-06 DIAGNOSIS — R82998 Other abnormal findings in urine: Secondary | ICD-10-CM | POA: Diagnosis not present

## 2022-09-19 ENCOUNTER — Other Ambulatory Visit (HOSPITAL_COMMUNITY): Payer: Self-pay

## 2022-10-07 ENCOUNTER — Other Ambulatory Visit (HOSPITAL_COMMUNITY): Payer: Self-pay

## 2022-10-07 DIAGNOSIS — R059 Cough, unspecified: Secondary | ICD-10-CM | POA: Diagnosis not present

## 2022-10-07 DIAGNOSIS — J069 Acute upper respiratory infection, unspecified: Secondary | ICD-10-CM | POA: Diagnosis not present

## 2022-10-07 MED ORDER — AZITHROMYCIN 250 MG PO TABS
ORAL_TABLET | ORAL | 0 refills | Status: AC
  Filled 2022-10-07: qty 6, 5d supply, fill #0

## 2022-10-14 ENCOUNTER — Other Ambulatory Visit (HOSPITAL_COMMUNITY): Payer: Self-pay

## 2022-10-14 MED ORDER — MELOXICAM 15 MG PO TABS
7.5000 mg | ORAL_TABLET | Freq: Every day | ORAL | 3 refills | Status: AC
  Filled 2022-10-14: qty 15, 15d supply, fill #0
  Filled 2022-12-19: qty 15, 15d supply, fill #1
  Filled 2023-01-31: qty 15, 15d supply, fill #2
  Filled 2023-05-13: qty 15, 15d supply, fill #3

## 2022-12-19 ENCOUNTER — Other Ambulatory Visit (HOSPITAL_COMMUNITY): Payer: Self-pay

## 2022-12-19 DIAGNOSIS — H04123 Dry eye syndrome of bilateral lacrimal glands: Secondary | ICD-10-CM | POA: Diagnosis not present

## 2022-12-19 DIAGNOSIS — H5213 Myopia, bilateral: Secondary | ICD-10-CM | POA: Diagnosis not present

## 2022-12-19 MED ORDER — FLUOROMETHOLONE 0.1 % OP SUSP
1.0000 [drp] | Freq: Two times a day (BID) | OPHTHALMIC | 1 refills | Status: AC
  Filled 2022-12-19: qty 5, 25d supply, fill #0

## 2022-12-26 ENCOUNTER — Other Ambulatory Visit (HOSPITAL_COMMUNITY): Payer: Self-pay

## 2022-12-26 MED ORDER — LEVOTHYROXINE SODIUM 112 MCG PO TABS
112.0000 ug | ORAL_TABLET | Freq: Every day | ORAL | 3 refills | Status: DC
Start: 2022-12-26 — End: 2023-10-20
  Filled 2022-12-26: qty 90, 90d supply, fill #0
  Filled 2023-01-31 – 2023-03-23 (×3): qty 90, 90d supply, fill #1
  Filled 2023-05-13 – 2023-06-17 (×2): qty 90, 90d supply, fill #2
  Filled 2023-07-17: qty 15, 15d supply, fill #3
  Filled 2023-07-17: qty 30, 30d supply, fill #3
  Filled 2023-07-17: qty 45, 45d supply, fill #0
  Filled 2023-07-17: qty 45, 45d supply, fill #3
  Filled 2023-09-02: qty 45, 45d supply, fill #1

## 2023-01-31 ENCOUNTER — Other Ambulatory Visit (HOSPITAL_COMMUNITY): Payer: Self-pay

## 2023-02-08 ENCOUNTER — Other Ambulatory Visit (HOSPITAL_COMMUNITY): Payer: Self-pay

## 2023-03-24 ENCOUNTER — Other Ambulatory Visit (HOSPITAL_COMMUNITY): Payer: Self-pay

## 2023-03-24 DIAGNOSIS — L570 Actinic keratosis: Secondary | ICD-10-CM | POA: Diagnosis not present

## 2023-03-24 DIAGNOSIS — L858 Other specified epidermal thickening: Secondary | ICD-10-CM | POA: Diagnosis not present

## 2023-03-24 DIAGNOSIS — L821 Other seborrheic keratosis: Secondary | ICD-10-CM | POA: Diagnosis not present

## 2023-04-02 DIAGNOSIS — T17208A Unspecified foreign body in pharynx causing other injury, initial encounter: Secondary | ICD-10-CM | POA: Diagnosis not present

## 2023-04-02 DIAGNOSIS — H6123 Impacted cerumen, bilateral: Secondary | ICD-10-CM | POA: Diagnosis not present

## 2023-04-02 DIAGNOSIS — J3489 Other specified disorders of nose and nasal sinuses: Secondary | ICD-10-CM | POA: Diagnosis not present

## 2023-05-13 ENCOUNTER — Other Ambulatory Visit (HOSPITAL_COMMUNITY): Payer: Self-pay

## 2023-05-13 ENCOUNTER — Other Ambulatory Visit: Payer: Self-pay

## 2023-05-20 DIAGNOSIS — L821 Other seborrheic keratosis: Secondary | ICD-10-CM | POA: Diagnosis not present

## 2023-06-17 ENCOUNTER — Other Ambulatory Visit (HOSPITAL_COMMUNITY): Payer: Self-pay

## 2023-07-15 DIAGNOSIS — Z01419 Encounter for gynecological examination (general) (routine) without abnormal findings: Secondary | ICD-10-CM | POA: Diagnosis not present

## 2023-07-15 DIAGNOSIS — Z681 Body mass index (BMI) 19 or less, adult: Secondary | ICD-10-CM | POA: Diagnosis not present

## 2023-07-15 DIAGNOSIS — Z1231 Encounter for screening mammogram for malignant neoplasm of breast: Secondary | ICD-10-CM | POA: Diagnosis not present

## 2023-07-16 ENCOUNTER — Other Ambulatory Visit (HOSPITAL_COMMUNITY): Payer: Self-pay

## 2023-07-16 MED ORDER — PROGESTERONE MICRONIZED 100 MG PO CAPS
200.0000 mg | ORAL_CAPSULE | Freq: Every day | ORAL | 0 refills | Status: AC
  Filled 2023-07-16 – 2023-11-21 (×4): qty 24, 12d supply, fill #0

## 2023-07-17 ENCOUNTER — Other Ambulatory Visit (HOSPITAL_COMMUNITY): Payer: Self-pay

## 2023-07-18 ENCOUNTER — Other Ambulatory Visit (HOSPITAL_COMMUNITY): Payer: Self-pay

## 2023-07-23 DIAGNOSIS — L821 Other seborrheic keratosis: Secondary | ICD-10-CM | POA: Diagnosis not present

## 2023-07-23 DIAGNOSIS — B078 Other viral warts: Secondary | ICD-10-CM | POA: Diagnosis not present

## 2023-07-25 DIAGNOSIS — Z1212 Encounter for screening for malignant neoplasm of rectum: Secondary | ICD-10-CM | POA: Diagnosis not present

## 2023-07-25 DIAGNOSIS — E039 Hypothyroidism, unspecified: Secondary | ICD-10-CM | POA: Diagnosis not present

## 2023-08-01 DIAGNOSIS — N950 Postmenopausal bleeding: Secondary | ICD-10-CM | POA: Diagnosis not present

## 2023-08-08 DIAGNOSIS — Z0000 Encounter for general adult medical examination without abnormal findings: Secondary | ICD-10-CM | POA: Diagnosis not present

## 2023-08-08 DIAGNOSIS — Z1331 Encounter for screening for depression: Secondary | ICD-10-CM | POA: Diagnosis not present

## 2023-08-08 DIAGNOSIS — E039 Hypothyroidism, unspecified: Secondary | ICD-10-CM | POA: Diagnosis not present

## 2023-09-02 ENCOUNTER — Other Ambulatory Visit: Payer: Self-pay

## 2023-09-17 DIAGNOSIS — R9389 Abnormal findings on diagnostic imaging of other specified body structures: Secondary | ICD-10-CM | POA: Diagnosis not present

## 2023-09-17 DIAGNOSIS — N840 Polyp of corpus uteri: Secondary | ICD-10-CM | POA: Diagnosis not present

## 2023-09-17 DIAGNOSIS — N950 Postmenopausal bleeding: Secondary | ICD-10-CM | POA: Diagnosis not present

## 2023-10-19 ENCOUNTER — Other Ambulatory Visit (HOSPITAL_COMMUNITY): Payer: Self-pay

## 2023-10-20 ENCOUNTER — Other Ambulatory Visit (HOSPITAL_COMMUNITY): Payer: Self-pay

## 2023-10-20 MED ORDER — LEVOTHYROXINE SODIUM 112 MCG PO TABS
112.0000 ug | ORAL_TABLET | Freq: Every day | ORAL | 3 refills | Status: AC
  Filled 2023-10-20 – 2023-10-23 (×2): qty 90, 90d supply, fill #0
  Filled 2024-01-15: qty 90, 90d supply, fill #1

## 2023-10-21 ENCOUNTER — Other Ambulatory Visit (HOSPITAL_COMMUNITY): Payer: Self-pay

## 2023-10-23 ENCOUNTER — Other Ambulatory Visit (HOSPITAL_COMMUNITY): Payer: Self-pay

## 2023-11-21 ENCOUNTER — Other Ambulatory Visit: Payer: Self-pay

## 2023-11-21 ENCOUNTER — Other Ambulatory Visit (HOSPITAL_COMMUNITY): Payer: Self-pay

## 2023-11-27 ENCOUNTER — Other Ambulatory Visit (HOSPITAL_COMMUNITY): Payer: Self-pay

## 2023-11-27 MED ORDER — IBUPROFEN 800 MG PO TABS
800.0000 mg | ORAL_TABLET | Freq: Three times a day (TID) | ORAL | 0 refills | Status: AC | PRN
  Filled 2023-11-27: qty 20, 7d supply, fill #0

## 2023-11-27 MED ORDER — MISOPROSTOL 200 MCG PO TABS
200.0000 ug | ORAL_TABLET | ORAL | 0 refills | Status: DC
  Filled 2023-11-27: qty 1, 1d supply, fill #0

## 2023-11-29 ENCOUNTER — Other Ambulatory Visit (HOSPITAL_COMMUNITY): Payer: Self-pay

## 2023-12-02 DIAGNOSIS — N8502 Endometrial intraepithelial neoplasia [EIN]: Secondary | ICD-10-CM | POA: Diagnosis not present

## 2023-12-02 DIAGNOSIS — N950 Postmenopausal bleeding: Secondary | ICD-10-CM | POA: Diagnosis not present

## 2023-12-04 ENCOUNTER — Other Ambulatory Visit (HOSPITAL_COMMUNITY): Payer: Self-pay

## 2023-12-04 ENCOUNTER — Ambulatory Visit (INDEPENDENT_AMBULATORY_CARE_PROVIDER_SITE_OTHER): Admitting: Otolaryngology

## 2023-12-04 ENCOUNTER — Encounter (INDEPENDENT_AMBULATORY_CARE_PROVIDER_SITE_OTHER): Payer: Self-pay | Admitting: Otolaryngology

## 2023-12-04 VITALS — BP 96/58 | HR 64 | Ht 62.0 in | Wt 100.0 lb

## 2023-12-04 DIAGNOSIS — H90A32 Mixed conductive and sensorineural hearing loss, unilateral, left ear with restricted hearing on the contralateral side: Secondary | ICD-10-CM | POA: Diagnosis not present

## 2023-12-04 DIAGNOSIS — H6992 Unspecified Eustachian tube disorder, left ear: Secondary | ICD-10-CM

## 2023-12-04 DIAGNOSIS — J3489 Other specified disorders of nose and nasal sinuses: Secondary | ICD-10-CM | POA: Diagnosis not present

## 2023-12-04 DIAGNOSIS — H8102 Meniere's disease, left ear: Secondary | ICD-10-CM

## 2023-12-04 DIAGNOSIS — H938X2 Other specified disorders of left ear: Secondary | ICD-10-CM | POA: Diagnosis not present

## 2023-12-04 MED ORDER — PREDNISONE 20 MG PO TABS
20.0000 mg | ORAL_TABLET | Freq: Every day | ORAL | 0 refills | Status: AC
  Filled 2023-12-04: qty 5, 5d supply, fill #0

## 2023-12-04 NOTE — Progress Notes (Signed)
 Dear Dr. Onita, Here is my assessment for our mutual patient, Tiffany Park. Thank you for allowing me the opportunity to care for your patient. Please do not hesitate to contact me should you have any other questions. Sincerely, Dr. Eldora Blanch  Otolaryngology Clinic Note Referring provider: Dr. Onita HPI:  Tiffany Park is a 63 y.o. female kindly referred by Dr. Onita for evaluation of hearing loss  Initial visit (11/2023): Patient reports: chronic, long-standing history of hearing loss, getting worse. Was diagnosed with Meniere's and well controlled on a diet (caffeine or salt brings it on) - sx include vertigo/fullness. Takes diuretic during attacks and this does well to abort it. Has not had a full attack for several years. Left ear is worse than right. Worst in background noise. When she flies, having some trouble equalizing pressures. Not currently having symptoms of fullness. Denies significant AR symptoms. Rare tinnitus. In process of getting hearing aids but noted to have some left TM scarring so seen here. H/o frequent ear infections AS.  Patient denies: ear pain, vertigo, drainage Patient additionally denies: deep pain in ear canal, eustachian tube symptoms such as popping/crackling Patient also denies barotrauma, vestibular suppressant use, ototoxic medication use Prior ear surgery: no   Noted chronic septal perforation, possibly after trauma. No epistaxis, numbness of face, no chronic sinus infections. Does not bother her unless nose is very dry. No nasal spray use.   H&N Surgery: Tonsillectomy Personal or FHx of bleeding dz or anesthesia difficulty: no  AP/AC: no  Tobacco: no  PMHx: Hypothyroidism, Scleroderma  Independent Review of Additional Tests or Records:  Dr. Maggie (04/02/2023): noted impacted cerumen, septal perforation after nasal trauma. Dx: NSP, Cerumen impaction Dr. Greig Senters Referral notes reviewed and uploaded or available in chart in media tab  (09/18/2023): noted difficluty hearing, prior with audiograms; prior diagnosis of Meniere's disease, managed with diet; no episode in 5 years.  Dr. Katherleen - HL, no ear surgeries; prior Meniere's disease(?); using dyazide  intermittently - noted be essentially stable hearing; consider hearing aids. Noted asymmetry in C tymp AS, right A.  Outside audio 2023 reviewed and interpreted: type C tymp AS, A AD    09/18/2023 Audio CHERILYNN):    PMH/Meds/All/SocHx/FamHx/ROS:   Past Medical History:  Diagnosis Date   Adnexal mass    2012   Gluten intolerance    cuaes diarrhea, stomach cramps   Raynaud disease    Scleroderma (HCC)      Past Surgical History:  Procedure Laterality Date   colonscopy  jan 2012   DILATION AND CURETTAGE OF UTERUS  1990   HYSTEROSCOPY WITH D & C N/A 09/16/2016   Procedure: DILATATION AND CURETTAGE /HYSTEROSCOPY, resection of polyp;  Surgeon: Rosalynn LELON Ingle, MD;  Location: [facility];  Service: Gynecology;  Laterality: N/A;   LAPAROSCOPIC SALPINGOOPHERECTOMY  2012   left   TONSILLECTOMY  age 24   UPPER GASTROINTESTINAL ENDOSCOPY  jan 2012    Family History  Problem Relation Age of Onset   Lung cancer Other    Hypertension Other    Coronary artery disease Other    Breast cancer Neg Hx      Social Connections: Not on file      Current Outpatient Medications:    Calcium Citrate-Vitamin D (CALCIUM CITRATE + D PO), Take 3 tablets by mouth daily. , Disp: , Rfl:    clobetasol ointment (TEMOVATE) 0.05 %, as needed., Disp: , Rfl:    diazepam  (VALIUM ) 5 MG tablet, Take 1  tablet (5 mg total) by mouth daily as needed as directed, Disp: 30 tablet, Rfl: 0   estradiol  (VIVELLE -DOT) 0.1 MG/24HR patch, Place 1 patch (0.1 mg total) onto the skin 2 (two) times a week., Disp: 24 patch, Rfl: 3   fluorometholone  (FML) 0.1 % ophthalmic suspension, Place 1 drop into both eyes 2 (two) times daily for 2 weeks., Disp: 5 mL, Rfl: 1   ibuprofen  (ADVIL ) 800 MG tablet, Take  1 tablet (800 mg total) by mouth every 8 (eight) hours as needed., Disp: 20 tablet, Rfl: 0   ibuprofen  (ADVIL ,MOTRIN ) 200 MG tablet, Take 600-800 mg by mouth every 6 (six) hours as needed. PAIN , Disp: , Rfl:    levothyroxine  (SYNTHROID ) 112 MCG tablet, Take 1 tablet (112 mcg total) by mouth daily or as directed, Disp: 90 tablet, Rfl: 3   meloxicam  (MOBIC ) 15 MG tablet, Take 0.5-1 tablets (7.5-15 mg total) by mouth daily., Disp: 15 tablet, Rfl: 3   misoprostol (CYTOTEC) 200 MCG tablet, Insert 1 tablet into vagina the night prior to procedure, Disp: 1 tablet, Rfl: 0   nitroGLYCERIN  (NITRODUR - DOSED IN MG/24 HR) 0.1 mg/hr patch, Apply 1 patch (0.1 mg total) onto the skin daily for 12 (twelve) hours as directed, Disp: 30 patch, Rfl: 0   predniSONE (DELTASONE) 20 MG tablet, Take 1 tablet (20 mg total) by mouth daily with breakfast for 5 days., Disp: 5 tablet, Rfl: 0   progesterone  (PROMETRIUM ) 100 MG capsule, Take 2 capsules (200 mg total) by mouth daily for 12 days., Disp: 24 capsule, Rfl: 0   scopolamine  (TRANSDERM-SCOP) 1 MG/3DAYS, Apply 1 patch behind ear every 72 hours as directed, Disp: 8 patch, Rfl: 0   diazepam  (VALIUM ) 5 MG tablet, TAKE ONE TABLET BY MOUTH EVERY 12 HOURS AS NEEDED FOR MUSCLE SPASM (Patient not taking: Reported on 12/04/2023), Disp: 30 tablet, Rfl: 5   diclofenac  sodium (VOLTAREN ) 1 % GEL, APPLY 2 GRAMS TOPICALLY 2 TIMES A DAY AS NEEDED (Patient not taking: Reported on 12/04/2023), Disp: 100 g, Rfl: 1   estradiol  (VIVELLE -DOT) 0.1 MG/24HR patch, Place 1 patch (0.1 mg total) onto the skin twice a week (Patient not taking: Reported on 12/04/2023), Disp: 8 patch, Rfl: 0   estradiol  (VIVELLE -DOT) 0.1 MG/24HR patch, Place 1 patch (0.1 mg total) onto the skin 2 (two) times a week. (Patient not taking: Reported on 12/04/2023), Disp: 24 patch, Rfl: 0   estradiol  (VIVELLE -DOT) 0.1 MG/24HR patch, PLACE 1 PATCH 2 TIMES A WEEK (Patient not taking: Reported on 12/04/2023), Disp: 24 patch, Rfl: 0    estradiol  (VIVELLE -DOT) 0.1 MG/24HR, Place 1 patch onto the skin 2 (two) times a week. Tuesday AND Friday  (Patient not taking: Reported on 12/04/2023), Disp: , Rfl:    levothyroxine  (SYNTHROID ) 112 MCG tablet, TAKE 1 TABLET BY MOUTH ONCE DAILY. (Patient not taking: Reported on 12/04/2023), Disp: 90 tablet, Rfl: 3   levothyroxine  (SYNTHROID ) 112 MCG tablet, Take 1 tablet (112 mcg total) by mouth daily. (Patient not taking: Reported on 12/04/2023), Disp: 90 tablet, Rfl: 3   meloxicam  (MOBIC ) 15 MG tablet, Take 0.5-1 tablets (7.5-15 mg total) by mouth daily. (Patient not taking: Reported on 12/04/2023), Disp: 15 tablet, Rfl: 0   meloxicam  (MOBIC ) 15 MG tablet, Take 1/2 -1 tablet (7.5-15 mg) by mouth daily. (Patient not taking: Reported on 12/04/2023), Disp: 15 tablet, Rfl: 5   progesterone  (PROMETRIUM ) 100 MG capsule, Take 2 capsules (200 mg total) by mouth daily. (Patient not taking: Reported on 12/04/2023), Disp: 24 capsule,  Rfl: 6   triamterene -hydrochlorothiazide  (DYAZIDE ) 37.5-25 MG capsule, Take 1 capsule by mouth daily. (Patient not taking: Reported on 12/04/2023), Disp: 30 capsule, Rfl: 3   Physical Exam:   BP (!) 96/58 (BP Location: Right Arm, Patient Position: Sitting, Cuff Size: Normal)   Pulse 64   Ht 5' 2 (1.575 m)   Wt 100 lb (45.4 kg)   SpO2 97%   BMI 18.29 kg/m   Salient findings:  CN II-XII intact Given history and complaints, ear microscopy was indicated and performed for evaluation with findings as below in physical exam section and in procedures; Bilateral EAC clear and TM intact with well pneumatized middle ear space on right; modest pars flaccida retraction AS with mild myringosclerosis but no effusion today No gross nystagmus Anterior rhinoscopy: Septum with 1 cm clean perforation; bilateral inferior turbinates without significant hypertrophy No lesions of oral cavity/oropharynx No obviously palpable neck masses/lymphadenopathy/thyromegaly No respiratory distress or  stridor  Seprately Identifiable Procedures:  Prior to initiating any procedures, risks/benefits/alternatives were explained to the patient and verbal consent obtained. Procedure: Bilateral ear microscopy using microscope (CPT [id number]) Pre-procedure diagnosis: left eustachian tube dysfunction and conductive hearing loss Post-procedure diagnosis: same Indication: see above; given patient's otologic complaints and history, for improved and comprehensive examination of external ear and tympanic membrane, bilateral otologic examination using microscope was performed. Prior to proceeding, verbal consent was obtained after discussion of R/B/A  Procedure: Patient was placed semi-recumbent. Both ear canals were examined using the microscope with findings above. Patient tolerated the procedure well.   Impression & Plans:  Tiffany Park is a 63 y.o. female with:  1. Mixed conductive and sensorineural hearing loss of left ear with restricted hearing of right ear   2. Sensation of fullness in left ear   3. Dysfunction of left eustachian tube   4. Nasal septal perforation   5. Meniere's disease of left ear    Most likely left ETD but no effusion today. Left TM retracted. Deferred endo because no effusion. Most likely given C tymp in the past and mild mixed hearing loss, she has had chronic ETD  In process of getting HA, which she is cleared for but given some CHL and ETD, can trial prednisone burst (has upcoming HA test next week) and do recommend flonase BID and ear popping. Advice given regarding what to do around flights. She'd like to follow up PRN so discussed return precautions Meniere's: well controlled, observe. 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    Dispense:  5 tablet    Refill:  0      Thank you for allowing me the opportunity to care for your patient. Please do not hesitate to contact me should you have any other questions.  Sincerely, Eldora Blanch, MD Otolaryngologist (ENT), [facility] Phone: [phone] Fax: [phone]  12/04/2023, 3:27 PM   MDM:  Level 4 - [phone] Complexity/Problems addressed: mod - chronic problems Data complexity: mod - independent interpretation of outside audiogram/test - Morbidity: mod  - Prescription Drug prescribed or managed: y

## 2023-12-04 NOTE — Patient Instructions (Addendum)
 Use afrin spray two sprays day before your flight, twice during flight and once day after Chew gum during flights Pop your ears multiple times per day Use flonase two sprays each nostril twice per day Prednisone take 20mg  tablet daily for 5 days

## 2023-12-09 ENCOUNTER — Telehealth (INDEPENDENT_AMBULATORY_CARE_PROVIDER_SITE_OTHER): Payer: Self-pay

## 2023-12-09 NOTE — Telephone Encounter (Signed)
 Tiffany Park from [facility] speech [facility] hearing [facility] needs records and medical clearance before she can continue further on her hearing aids.   She requested everything to be faxed over the fax number is [phone]  Her call back number is [phone]

## 2023-12-11 ENCOUNTER — Telehealth (INDEPENDENT_AMBULATORY_CARE_PROVIDER_SITE_OTHER): Payer: Self-pay

## 2023-12-11 NOTE — Telephone Encounter (Signed)
 Pt called for Tiffany Park stated that she had seen the audiologist and the audiologist wanted to know if it was okay for her to take a decongestant and if so what kind should she use.   Pt requested a call back.

## 2023-12-12 ENCOUNTER — Telehealth (INDEPENDENT_AMBULATORY_CARE_PROVIDER_SITE_OTHER): Payer: Self-pay | Admitting: Otolaryngology

## 2023-12-12 NOTE — Telephone Encounter (Signed)
 Patient stated that audiologist Dr.Myer asked if patient can take a decongestant, if so what non drowsy would you recommend? Please advise

## 2023-12-12 NOTE — Telephone Encounter (Signed)
 Attempted to contact patient but no answer. No option to leave a voicemail.

## 2023-12-15 NOTE — Telephone Encounter (Signed)
 LVM informing patient of providers recommendation.

## 2023-12-25 DIAGNOSIS — H04123 Dry eye syndrome of bilateral lacrimal glands: Secondary | ICD-10-CM | POA: Diagnosis not present

## 2023-12-25 DIAGNOSIS — H52203 Unspecified astigmatism, bilateral: Secondary | ICD-10-CM | POA: Diagnosis not present

## 2023-12-25 DIAGNOSIS — H5213 Myopia, bilateral: Secondary | ICD-10-CM | POA: Diagnosis not present

## 2023-12-25 DIAGNOSIS — H25013 Cortical age-related cataract, bilateral: Secondary | ICD-10-CM | POA: Diagnosis not present

## 2024-01-05 DIAGNOSIS — N393 Stress incontinence (female) (male): Secondary | ICD-10-CM | POA: Diagnosis not present

## 2024-01-13 ENCOUNTER — Other Ambulatory Visit (HOSPITAL_COMMUNITY): Payer: Self-pay

## 2024-01-13 ENCOUNTER — Encounter (INDEPENDENT_AMBULATORY_CARE_PROVIDER_SITE_OTHER): Payer: Self-pay | Admitting: Otolaryngology

## 2024-01-13 ENCOUNTER — Ambulatory Visit (INDEPENDENT_AMBULATORY_CARE_PROVIDER_SITE_OTHER): Admitting: Otolaryngology

## 2024-01-13 VITALS — BP 109/72 | HR 70 | Ht 62.0 in | Wt 100.0 lb

## 2024-01-13 DIAGNOSIS — H8102 Meniere's disease, left ear: Secondary | ICD-10-CM

## 2024-01-13 DIAGNOSIS — J3489 Other specified disorders of nose and nasal sinuses: Secondary | ICD-10-CM

## 2024-01-13 DIAGNOSIS — H90A32 Mixed conductive and sensorineural hearing loss, unilateral, left ear with restricted hearing on the contralateral side: Secondary | ICD-10-CM | POA: Diagnosis not present

## 2024-01-13 DIAGNOSIS — H6993 Unspecified Eustachian tube disorder, bilateral: Secondary | ICD-10-CM

## 2024-01-13 DIAGNOSIS — H938X3 Other specified disorders of ear, bilateral: Secondary | ICD-10-CM

## 2024-01-13 MED ORDER — AZELASTINE HCL 0.1 % NA SOLN
2.0000 | Freq: Two times a day (BID) | NASAL | 12 refills | Status: AC
  Filled 2024-01-13: qty 30, 30d supply, fill #0

## 2024-01-13 NOTE — Patient Instructions (Signed)
  Ok to take claritin Use two sprays of flonase in each nostril twice per day; use astelin spray two sprays each nostril daily

## 2024-01-13 NOTE — Progress Notes (Signed)
 Dear Dr. Onita, Here is my assessment for our mutual patient, Tiffany Park. Thank you for allowing me the opportunity to care for your patient. Please do not hesitate to contact me should you have any other questions. Sincerely, Dr. Eldora Blanch  Otolaryngology Clinic Note Referring provider: Dr. Onita HPI:  Tiffany Park is a 63 y.o. female kindly referred by Dr. Onita for evaluation of hearing loss  Initial visit (11/2023): Patient reports: chronic, long-standing history of hearing loss, getting worse. Was diagnosed with Meniere's and well controlled on a diet (caffeine or salt brings it on) - sx include vertigo/fullness. Takes diuretic during attacks and this does well to abort it. Has not had a full attack for several years. Left ear is worse than right. Worst in background noise. When she flies, having some trouble equalizing pressures. Not currently having symptoms of fullness. Denies significant AR symptoms. Rare tinnitus. In process of getting hearing aids but noted to have some left TM scarring so seen here. H/o frequent ear infections AS.  Patient denies: ear pain, vertigo, drainage Patient additionally denies: deep pain in ear canal, eustachian tube symptoms such as popping/crackling Patient also denies barotrauma, vestibular suppressant use, ototoxic medication use Prior ear surgery: no  Noted chronic septal perforation, possibly after trauma. No epistaxis, numbness of face, no chronic sinus infections. Does not bother her unless nose is very dry. No nasal spray use.   --------------------------------------------------------- 01/13/2024 Seen in follow up. Did get hearing aids about 3 weeks ago, hard time adjusting to them. Feeling of fullness -- sometimes there, sometimes not. Having more issues on right, but some on left as well ---- this all got worse after patient got sick and then flew so having issues with congestion and ear fullness. Some non-pulsatile tinnitus, no pain  currently. Not rinsing, using saline spray, using PO antihistamine (claritin works best). Using flonase. Patient denies: ear pain, vertigo, Patient also denies barotrauma, vestibular suppressant use, ototoxic medication use Prior ear surgery: no  H&N Surgery: Tonsillectomy Personal or FHx of bleeding dz or anesthesia difficulty: no  AP/AC: no  Tobacco: no  PMHx: Hypothyroidism, Scleroderma  Independent Review of Additional Tests or Records:  Dr. Maggie (04/02/2023): noted impacted cerumen, septal perforation after nasal trauma. Dx: NSP, Cerumen impaction Dr. Greig Senters Referral notes reviewed and uploaded or available in chart in media tab (09/18/2023): noted difficluty hearing, prior with audiograms; prior diagnosis of Meniere's disease, managed with diet; no episode in 5 years.  Dr. Katherleen - HL, no ear surgeries; prior Meniere's disease(?); using dyazide  intermittently - noted be essentially stable hearing; consider hearing aids. Noted asymmetry in C tymp AS, right A.  Outside audio 2023 reviewed and interpreted: type C tymp AS, A AD    09/18/2023 Audio CHERILYNN):    PMH/Meds/All/SocHx/FamHx/ROS:   Past Medical History:  Diagnosis Date   Adnexal mass    2012   Gluten intolerance    cuaes diarrhea, stomach cramps   Raynaud disease    Scleroderma (HCC)      Past Surgical History:  Procedure Laterality Date   colonscopy  jan 2012   DILATION AND CURETTAGE OF UTERUS  1990   HYSTEROSCOPY WITH D & C N/A 09/16/2016   Procedure: DILATATION AND CURETTAGE /HYSTEROSCOPY, resection of polyp;  Surgeon: Rosalynn LELON Ingle, MD;  Location: [facility];  Service: Gynecology;  Laterality: N/A;   LAPAROSCOPIC SALPINGOOPHERECTOMY  2012   left   TONSILLECTOMY  age 56   UPPER GASTROINTESTINAL ENDOSCOPY  jan 2012  Family History  Problem Relation Age of Onset   Lung cancer Other    Hypertension Other    Coronary artery disease Other    Breast cancer Neg Hx      Social  Connections: Not on file      Current Outpatient Medications:    azelastine  (ASTELIN ) 0.1 % nasal spray, Place 2 sprays into both nostrils 2 (two) times daily. Use in each nostril as directed, Disp: 30 mL, Rfl: 12   Calcium Citrate-Vitamin D (CALCIUM CITRATE + D PO), Take 3 tablets by mouth daily. , Disp: , Rfl:    clobetasol ointment (TEMOVATE) 0.05 %, as needed., Disp: , Rfl:    diazepam  (VALIUM ) 5 MG tablet, Take 1 tablet (5 mg total) by mouth daily as needed as directed, Disp: 30 tablet, Rfl: 0   estradiol  (VIVELLE -DOT) 0.1 MG/24HR patch, Place 1 patch (0.1 mg total) onto the skin 2 (two) times a week., Disp: 24 patch, Rfl: 3   fluorometholone  (FML) 0.1 % ophthalmic suspension, Place 1 drop into both eyes 2 (two) times daily for 2 weeks., Disp: 5 mL, Rfl: 1   ibuprofen  (ADVIL ) 800 MG tablet, Take 1 tablet (800 mg total) by mouth every 8 (eight) hours as needed., Disp: 20 tablet, Rfl: 0   ibuprofen  (ADVIL ,MOTRIN ) 200 MG tablet, Take 600-800 mg by mouth every 6 (six) hours as needed. PAIN , Disp: , Rfl:    levothyroxine  (SYNTHROID ) 112 MCG tablet, Take 1 tablet (112 mcg total) by mouth daily or as directed, Disp: 90 tablet, Rfl: 3   meloxicam  (MOBIC ) 15 MG tablet, Take 0.5-1 tablets (7.5-15 mg total) by mouth daily., Disp: 15 tablet, Rfl: 3   nitroGLYCERIN  (NITRODUR - DOSED IN MG/24 HR) 0.1 mg/hr patch, Apply 1 patch (0.1 mg total) onto the skin daily for 12 (twelve) hours as directed, Disp: 30 patch, Rfl: 0   progesterone  (PROMETRIUM ) 100 MG capsule, Take 2 capsules (200 mg total) by mouth daily for 12 days., Disp: 24 capsule, Rfl: 0   scopolamine  (TRANSDERM-SCOP) 1 MG/3DAYS, Apply 1 patch behind ear every 72 hours as directed, Disp: 8 patch, Rfl: 0   diazepam  (VALIUM ) 5 MG tablet, TAKE ONE TABLET BY MOUTH EVERY 12 HOURS AS NEEDED FOR MUSCLE SPASM (Patient not taking: Reported on 01/13/2024), Disp: 30 tablet, Rfl: 5   diclofenac  sodium (VOLTAREN ) 1 % GEL, APPLY 2 GRAMS TOPICALLY 2 TIMES A DAY  AS NEEDED (Patient not taking: Reported on 01/13/2024), Disp: 100 g, Rfl: 1   estradiol  (VIVELLE -DOT) 0.1 MG/24HR patch, Place 1 patch (0.1 mg total) onto the skin twice a week (Patient not taking: Reported on 01/13/2024), Disp: 8 patch, Rfl: 0   estradiol  (VIVELLE -DOT) 0.1 MG/24HR patch, Place 1 patch (0.1 mg total) onto the skin 2 (two) times a week. (Patient not taking: Reported on 01/13/2024), Disp: 24 patch, Rfl: 0   estradiol  (VIVELLE -DOT) 0.1 MG/24HR patch, PLACE 1 PATCH 2 TIMES A WEEK (Patient not taking: Reported on 01/13/2024), Disp: 24 patch, Rfl: 0   estradiol  (VIVELLE -DOT) 0.1 MG/24HR, Place 1 patch onto the skin 2 (two) times a week. Tuesday AND Friday  (Patient not taking: Reported on 01/13/2024), Disp: , Rfl:    levothyroxine  (SYNTHROID ) 112 MCG tablet, TAKE 1 TABLET BY MOUTH ONCE DAILY. (Patient not taking: Reported on 01/13/2024), Disp: 90 tablet, Rfl: 3   levothyroxine  (SYNTHROID ) 112 MCG tablet, Take 1 tablet (112 mcg total) by mouth daily. (Patient not taking: Reported on 01/13/2024), Disp: 90 tablet, Rfl: 3   meloxicam  (MOBIC )  15 MG tablet, Take 0.5-1 tablets (7.5-15 mg total) by mouth daily. (Patient not taking: Reported on 01/13/2024), Disp: 15 tablet, Rfl: 0   meloxicam  (MOBIC ) 15 MG tablet, Take 1/2 -1 tablet (7.5-15 mg) by mouth daily. (Patient not taking: Reported on 01/13/2024), Disp: 15 tablet, Rfl: 5   progesterone  (PROMETRIUM ) 100 MG capsule, Take 2 capsules (200 mg total) by mouth daily. (Patient not taking: Reported on 01/13/2024), Disp: 24 capsule, Rfl: 6   triamterene -hydrochlorothiazide  (DYAZIDE ) 37.5-25 MG capsule, Take 1 capsule by mouth daily. (Patient not taking: Reported on 01/13/2024), Disp: 30 capsule, Rfl: 3   Physical Exam:   BP 109/72 (BP Location: Right Arm, Patient Position: Sitting, Cuff Size: Normal)   Pulse 70   Ht 5' 2 (1.575 m)   Wt 100 lb (45.4 kg)   SpO2 98%   BMI 18.29 kg/m   Salient findings:  CN II-XII intact Bilateral EAC clear and  TM intact with well pneumatized middle ear space on right; modest pars flaccida retraction AS with mild myringosclerosis but no effusion again today No gross nystagmus Anterior rhinoscopy: Septum with 1 cm clean perforation; bilateral inferior turbinates without significant hypertrophy No lesions of oral cavity/oropharynx No obviously palpable neck masses/lymphadenopathy/thyromegaly No respiratory distress or stridor  Seprately Identifiable Procedures:  Prior to initiating any procedures, risks/benefits/alternatives were explained to the patient and verbal consent obtained. None today   Impression & Plans:  Shalona Harbour is a 63 y.o. female with:  1. Mixed conductive and sensorineural hearing loss of left ear with restricted hearing of right ear   2. Sensation of fullness in both ears   3. Dysfunction of both eustachian tubes   4. Meniere's disease of left ear   5. Nasal septal perforation    Most likely appears to be ETD worse after URI but again no effusion today. Left TM retracted.. Prior type C tymp and mild mixed hearing loss Did get HA but not helping with fullness. As such, we discussed options -- can trial pred but she deferred again today. Given persistent sx, however, she did agree for medical management with continued flonase BID, astelin  BID, daily rinses, and popping ears. Will see her back in about 3 months; if sx persist, consider audio and tymp tube HL: continue amplification  Meniere's: well controlled, observe. 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 Use in each nostril as directed    Dispense:  30 mL    Refill:  12      Thank you for allowing me the  opportunity to care for your patient. Please do not hesitate to contact me should you have any other questions.  Sincerely, Eldora Blanch, MD Otolaryngologist (ENT), [facility] Phone: [phone] Fax: [phone]  01/18/2024, 3:32 PM   MDM:  Level 4 - [id number] Complexity/Problems addressed: mod - chronic problems, worsening Data complexity: low - Morbidity: mod  - Drug prescribed or managed: y

## 2024-01-15 ENCOUNTER — Other Ambulatory Visit: Payer: Self-pay

## 2024-01-19 ENCOUNTER — Other Ambulatory Visit (HOSPITAL_COMMUNITY): Payer: Self-pay

## 2024-02-28 ENCOUNTER — Other Ambulatory Visit: Payer: Self-pay

## 2024-03-01 ENCOUNTER — Other Ambulatory Visit: Payer: Self-pay

## 2024-03-01 MED ORDER — DIAZEPAM 5 MG PO TABS
5.0000 mg | ORAL_TABLET | Freq: Every day | ORAL | 0 refills | Status: AC | PRN
  Filled 2024-03-01 – 2024-03-02 (×2): qty 30, 30d supply, fill #0

## 2024-03-02 ENCOUNTER — Other Ambulatory Visit: Payer: Self-pay

## 2024-03-02 ENCOUNTER — Other Ambulatory Visit (HOSPITAL_COMMUNITY): Payer: Self-pay

## 2024-03-02 MED ORDER — NEOMYCIN-POLYMYXIN-DEXAMETH 3.5-10000-0.1 OP SUSP
1.0000 [drp] | Freq: Four times a day (QID) | OPHTHALMIC | 0 refills | Status: AC
  Filled 2024-03-02: qty 5, 25d supply, fill #0

## 2024-03-15 ENCOUNTER — Other Ambulatory Visit: Payer: Self-pay

## 2024-03-24 ENCOUNTER — Other Ambulatory Visit: Payer: Self-pay | Admitting: Obstetrics and Gynecology

## 2024-03-25 ENCOUNTER — Other Ambulatory Visit (HOSPITAL_COMMUNITY): Payer: Self-pay

## 2024-03-25 MED ORDER — PHENAZOPYRIDINE HCL 200 MG PO TABS
200.0000 mg | ORAL_TABLET | Freq: Three times a day (TID) | ORAL | 3 refills | Status: AC | PRN
  Filled 2024-03-25: qty 9, 3d supply, fill #0

## 2024-03-25 MED ORDER — CEPHALEXIN 500 MG PO CAPS
500.0000 mg | ORAL_CAPSULE | Freq: Four times a day (QID) | ORAL | 0 refills | Status: AC
  Filled 2024-03-25: qty 12, 3d supply, fill #0

## 2024-03-25 MED ORDER — METHOCARBAMOL 750 MG PO TABS
750.0000 mg | ORAL_TABLET | Freq: Three times a day (TID) | ORAL | 0 refills | Status: AC | PRN
  Filled 2024-03-25: qty 21, 7d supply, fill #0

## 2024-03-29 LAB — SURGICAL PATHOLOGY

## 2024-04-06 ENCOUNTER — Ambulatory Visit (INDEPENDENT_AMBULATORY_CARE_PROVIDER_SITE_OTHER): Admitting: Otolaryngology
